# Patient Record
Sex: Female | Born: 1955 | Race: White | Hispanic: No | Marital: Married | Smoking: Former smoker
Health system: Southern US, Community
[De-identification: ages and names within clinical notes are randomized; demographics above are authoritative.]

## PROBLEM LIST (undated history)

## (undated) DIAGNOSIS — N951 Menopausal and female climacteric states: Secondary | ICD-10-CM

## (undated) DIAGNOSIS — M8580 Other specified disorders of bone density and structure, unspecified site: Secondary | ICD-10-CM

## (undated) DIAGNOSIS — R7989 Other specified abnormal findings of blood chemistry: Secondary | ICD-10-CM

## (undated) DIAGNOSIS — B019 Varicella without complication: Secondary | ICD-10-CM

## (undated) DIAGNOSIS — D649 Anemia, unspecified: Secondary | ICD-10-CM

## (undated) DIAGNOSIS — N390 Urinary tract infection, site not specified: Secondary | ICD-10-CM

## (undated) DIAGNOSIS — R011 Cardiac murmur, unspecified: Secondary | ICD-10-CM

## (undated) DIAGNOSIS — M1990 Unspecified osteoarthritis, unspecified site: Secondary | ICD-10-CM

## (undated) HISTORY — DX: Cardiac murmur, unspecified: R01.1

## (undated) HISTORY — DX: Other specified disorders of bone density and structure, unspecified site: M85.80

## (undated) HISTORY — DX: Other specified abnormal findings of blood chemistry: R79.89

## (undated) HISTORY — DX: Menopausal and female climacteric states: N95.1

## (undated) HISTORY — DX: Urinary tract infection, site not specified: N39.0

## (undated) HISTORY — DX: Varicella without complication: B01.9

---

## 1986-08-20 HISTORY — PX: AUGMENTATION MAMMAPLASTY: SUR837

## 1986-08-20 HISTORY — PX: BREAST SURGERY: SHX581

## 2000-01-19 ENCOUNTER — Encounter: Admission: RE | Admit: 2000-01-19 | Discharge: 2000-01-19 | Payer: Self-pay | Admitting: *Deleted

## 2000-01-19 ENCOUNTER — Encounter: Payer: Self-pay | Admitting: *Deleted

## 2001-03-28 ENCOUNTER — Encounter: Payer: Self-pay | Admitting: *Deleted

## 2001-03-28 ENCOUNTER — Encounter: Admission: RE | Admit: 2001-03-28 | Discharge: 2001-03-28 | Payer: Self-pay | Admitting: *Deleted

## 2001-04-10 ENCOUNTER — Encounter: Payer: Self-pay | Admitting: Family Medicine

## 2001-04-10 ENCOUNTER — Encounter: Admission: RE | Admit: 2001-04-10 | Discharge: 2001-04-10 | Payer: Self-pay | Admitting: Family Medicine

## 2001-08-28 ENCOUNTER — Other Ambulatory Visit: Admission: RE | Admit: 2001-08-28 | Discharge: 2001-08-28 | Payer: Self-pay | Admitting: *Deleted

## 2002-04-30 ENCOUNTER — Encounter: Admission: RE | Admit: 2002-04-30 | Discharge: 2002-04-30 | Payer: Self-pay | Admitting: *Deleted

## 2002-04-30 ENCOUNTER — Encounter: Payer: Self-pay | Admitting: *Deleted

## 2002-09-10 ENCOUNTER — Other Ambulatory Visit: Admission: RE | Admit: 2002-09-10 | Discharge: 2002-09-10 | Payer: Self-pay | Admitting: *Deleted

## 2003-05-31 ENCOUNTER — Encounter: Admission: RE | Admit: 2003-05-31 | Discharge: 2003-05-31 | Payer: Self-pay | Admitting: *Deleted

## 2003-05-31 ENCOUNTER — Encounter: Payer: Self-pay | Admitting: *Deleted

## 2003-09-02 ENCOUNTER — Other Ambulatory Visit: Admission: RE | Admit: 2003-09-02 | Discharge: 2003-09-02 | Payer: Self-pay | Admitting: *Deleted

## 2004-06-29 ENCOUNTER — Encounter: Admission: RE | Admit: 2004-06-29 | Discharge: 2004-06-29 | Payer: Self-pay | Admitting: *Deleted

## 2004-09-20 ENCOUNTER — Other Ambulatory Visit: Admission: RE | Admit: 2004-09-20 | Discharge: 2004-09-20 | Payer: Self-pay | Admitting: *Deleted

## 2005-07-20 ENCOUNTER — Encounter: Admission: RE | Admit: 2005-07-20 | Discharge: 2005-07-20 | Payer: Self-pay | Admitting: *Deleted

## 2005-08-14 ENCOUNTER — Encounter: Admission: RE | Admit: 2005-08-14 | Discharge: 2005-08-14 | Payer: Self-pay | Admitting: *Deleted

## 2005-11-26 ENCOUNTER — Other Ambulatory Visit: Admission: RE | Admit: 2005-11-26 | Discharge: 2005-11-26 | Payer: Self-pay | Admitting: *Deleted

## 2006-08-28 ENCOUNTER — Encounter: Admission: RE | Admit: 2006-08-28 | Discharge: 2006-08-28 | Payer: Self-pay | Admitting: *Deleted

## 2006-12-05 ENCOUNTER — Other Ambulatory Visit: Admission: RE | Admit: 2006-12-05 | Discharge: 2006-12-05 | Payer: Self-pay | Admitting: *Deleted

## 2007-09-19 ENCOUNTER — Encounter: Admission: RE | Admit: 2007-09-19 | Discharge: 2007-09-19 | Payer: Self-pay | Admitting: *Deleted

## 2007-12-18 ENCOUNTER — Other Ambulatory Visit: Admission: RE | Admit: 2007-12-18 | Discharge: 2007-12-18 | Payer: Self-pay | Admitting: Gynecology

## 2008-01-15 ENCOUNTER — Ambulatory Visit: Payer: Self-pay | Admitting: Internal Medicine

## 2008-01-15 DIAGNOSIS — N951 Menopausal and female climacteric states: Secondary | ICD-10-CM | POA: Insufficient documentation

## 2008-01-15 DIAGNOSIS — R011 Cardiac murmur, unspecified: Secondary | ICD-10-CM

## 2008-01-15 HISTORY — DX: Menopausal and female climacteric states: N95.1

## 2008-01-15 HISTORY — DX: Cardiac murmur, unspecified: R01.1

## 2008-01-22 ENCOUNTER — Encounter: Payer: Self-pay | Admitting: Internal Medicine

## 2008-01-22 ENCOUNTER — Ambulatory Visit: Payer: Self-pay

## 2008-02-27 ENCOUNTER — Ambulatory Visit: Payer: Self-pay | Admitting: Internal Medicine

## 2008-02-27 LAB — CONVERTED CEMR LAB
AST: 27 units/L (ref 0–37)
Albumin: 4 g/dL (ref 3.5–5.2)
Basophils Absolute: 0 10*3/uL (ref 0.0–0.1)
Basophils Relative: 0.3 % (ref 0.0–1.0)
Calcium: 9.2 mg/dL (ref 8.4–10.5)
Chloride: 104 meq/L (ref 96–112)
Cholesterol: 163 mg/dL (ref 0–200)
Creatinine, Ser: 0.8 mg/dL (ref 0.4–1.2)
Eosinophils Absolute: 0.2 10*3/uL (ref 0.0–0.7)
GFR calc Af Amer: 97 mL/min
GFR calc non Af Amer: 80 mL/min
Glucose, Urine, Semiquant: NEGATIVE
HCT: 35.8 % — ABNORMAL LOW (ref 36.0–46.0)
HDL: 52.8 mg/dL (ref >39.0)
MCHC: 34.9 g/dL (ref 30.0–36.0)
MCV: 98 fL (ref 78.0–100.0)
Monocytes Absolute: 0.4 10*3/uL (ref 0.1–1.0)
Neutrophils Relative %: 54.8 % (ref 43.0–77.0)
RBC: 3.66 M/uL — ABNORMAL LOW (ref 3.87–5.11)
Specific Gravity, Urine: 1.025
TSH: 1.02 microintl units/mL (ref 0.35–5.50)
Total Bilirubin: 0.8 mg/dL (ref 0.3–1.2)
VLDL: 8 mg/dL (ref 0–40)
pH: 5

## 2008-03-05 ENCOUNTER — Ambulatory Visit: Payer: Self-pay | Admitting: Internal Medicine

## 2008-06-03 ENCOUNTER — Ambulatory Visit: Payer: Self-pay | Admitting: Internal Medicine

## 2008-06-03 DIAGNOSIS — M8580 Other specified disorders of bone density and structure, unspecified site: Secondary | ICD-10-CM

## 2008-06-03 DIAGNOSIS — M818 Other osteoporosis without current pathological fracture: Secondary | ICD-10-CM | POA: Insufficient documentation

## 2008-06-03 HISTORY — DX: Other specified disorders of bone density and structure, unspecified site: M85.80

## 2008-09-22 ENCOUNTER — Encounter: Admission: RE | Admit: 2008-09-22 | Discharge: 2008-09-22 | Payer: Self-pay | Admitting: Gynecology

## 2008-10-08 ENCOUNTER — Ambulatory Visit: Payer: Self-pay | Admitting: Internal Medicine

## 2008-10-08 LAB — CONVERTED CEMR LAB
Calcium: 9.6 mg/dL
Vit D, 25-Hydroxy: 60 ng/mL

## 2009-09-30 ENCOUNTER — Encounter: Admission: RE | Admit: 2009-09-30 | Discharge: 2009-09-30 | Payer: Self-pay | Admitting: Gynecology

## 2010-09-09 ENCOUNTER — Encounter: Payer: Self-pay | Admitting: *Deleted

## 2010-11-01 ENCOUNTER — Other Ambulatory Visit: Payer: Self-pay | Admitting: Gynecology

## 2010-11-01 DIAGNOSIS — Z1231 Encounter for screening mammogram for malignant neoplasm of breast: Secondary | ICD-10-CM

## 2010-11-06 ENCOUNTER — Ambulatory Visit
Admission: RE | Admit: 2010-11-06 | Discharge: 2010-11-06 | Disposition: A | Discharge status: Home / Self Care | Payer: Self-pay | Source: Ambulatory Visit | Attending: Gynecology | Admitting: Gynecology

## 2010-11-06 DIAGNOSIS — Z1231 Encounter for screening mammogram for malignant neoplasm of breast: Secondary | ICD-10-CM

## 2011-05-29 ENCOUNTER — Encounter: Payer: Self-pay | Admitting: Internal Medicine

## 2011-05-29 ENCOUNTER — Ambulatory Visit (INDEPENDENT_AMBULATORY_CARE_PROVIDER_SITE_OTHER): Payer: BC Managed Care – PPO | Admitting: Internal Medicine

## 2011-05-29 VITALS — BP 118/70 | Temp 98.0°F | Wt 129.0 lb

## 2011-05-29 DIAGNOSIS — R0789 Other chest pain: Secondary | ICD-10-CM

## 2011-05-29 NOTE — Progress Notes (Signed)
  Subjective:    Patient ID: Bridget Mccarthy, female    DOB: 1955-09-27, 55 y.o.   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 She noted pain involving her right lateral chest wall 6 days ago. She has had persistent pain since this time. She completed her final performance 3 days ago and has had persistent discomfort. Pain is aggravated by deep inspiration and side to side twisting at the waist. She has a history of idiopathic osteoporosis but to her knowledge she has had no prior bone density study. She has been treated for vitamin D deficiency in the past but takes no supplements at the present time    Review of Systems  Constitutional: Negative.   HENT: Negative for hearing loss, congestion, sore throat, rhinorrhea, dental problem, sinus pressure and tinnitus.   Eyes: Negative for pain, discharge and visual disturbance.  Respiratory: Negative for cough and shortness of breath.   Cardiovascular: Positive for chest pain. Negative for palpitations and leg swelling.  Gastrointestinal: Negative for nausea, vomiting, abdominal pain, diarrhea, constipation, blood in stool and abdominal distention.  Genitourinary: Negative for dysuria, urgency, frequency, hematuria, flank pain, vaginal bleeding, vaginal discharge, difficulty urinating, vaginal pain and pelvic pain.  Musculoskeletal: Negative for joint swelling, arthralgias and gait problem.  Skin: Negative for rash.  Neurological: Negative for dizziness, syncope, speech difficulty, weakness, numbness and headaches.  Hematological: Negative for adenopathy.  Psychiatric/Behavioral: Negative for behavioral problems, dysphoric mood and agitation. The patient is not nervous/anxious.        Objective:   Physical Exam  Constitutional: She appears well-developed and well-nourished. No distress.  Pulmonary/Chest: Effort normal and breath sounds normal. No respiratory distress. She has no wheezes.       Slight tenderness in  the right lateral chest wall. Equal breath sounds pain is aggravated by side to side twisting at the waist          Assessment & Plan:   Chest wall pain. Suspect musculoligamentous. We'll treat with Advil and observe History of osteoporosis. We'll check a bone density calcium and vitamin D supplements encouraged

## 2011-05-29 NOTE — Patient Instructions (Signed)
Bone density study as discussed  Ibuprofen  As needed  Take a calcium supplement, plus 2000  units of vitamin D

## 2011-06-01 ENCOUNTER — Ambulatory Visit (INDEPENDENT_AMBULATORY_CARE_PROVIDER_SITE_OTHER)
Admission: RE | Admit: 2011-06-01 | Discharge: 2011-06-01 | Disposition: A | Discharge status: Home / Self Care | Payer: BC Managed Care – PPO | Source: Ambulatory Visit

## 2011-06-01 DIAGNOSIS — M818 Other osteoporosis without current pathological fracture: Secondary | ICD-10-CM

## 2011-06-01 DIAGNOSIS — R0789 Other chest pain: Secondary | ICD-10-CM

## 2011-06-06 ENCOUNTER — Encounter: Payer: Self-pay | Admitting: Internal Medicine

## 2011-07-11 ENCOUNTER — Telehealth: Payer: Self-pay | Admitting: Internal Medicine

## 2011-07-11 NOTE — Telephone Encounter (Signed)
Would start with an ultrasound that should define if the mass is coming from the implant. Normally the examining physician orders the MRI since they can answer the questions from the insurance company needed to justify the MRI. I am surprised that Dr. Audrie Lia did not order it if he felt it was a necessary   get the ultrasound done first

## 2011-07-11 NOTE — Telephone Encounter (Signed)
Pt called and said that she came in to see Dr Amador Cunas on 05/29/11 for pain in rt lower ribs. Pt says that she now has a knot under rt breast. Pt went to see Dr Collier Flowers, for checkup and that is when knot was discovered and said it could be pts breast implant. Recommended pt to get an MRI done, through Dr Lovell Sheehan. Pls call pt.

## 2011-07-11 NOTE — Telephone Encounter (Signed)
Unsure of whast needs to have mri- request pt to have dr Odis Luster, plastic surgeon, to send note and recommendation of what he th inks needs mri- they are closed until Monday, so sshe will call them on mondaty

## 2011-07-11 NOTE — Telephone Encounter (Signed)
Please help Bridget Mccarthy.

## 2011-07-18 ENCOUNTER — Other Ambulatory Visit: Payer: Self-pay | Admitting: Internal Medicine

## 2011-07-18 DIAGNOSIS — T8543XA Leakage of breast prosthesis and implant, initial encounter: Secondary | ICD-10-CM

## 2011-07-24 ENCOUNTER — Other Ambulatory Visit: Payer: Self-pay | Admitting: Internal Medicine

## 2011-07-26 ENCOUNTER — Other Ambulatory Visit: Payer: BC Managed Care – PPO

## 2011-11-15 ENCOUNTER — Other Ambulatory Visit: Payer: Self-pay | Admitting: Plastic Surgery

## 2012-08-21 ENCOUNTER — Encounter: Payer: Self-pay | Admitting: Family Medicine

## 2012-08-21 ENCOUNTER — Ambulatory Visit (INDEPENDENT_AMBULATORY_CARE_PROVIDER_SITE_OTHER): Payer: BC Managed Care – PPO | Admitting: Family Medicine

## 2012-08-21 VITALS — BP 110/70 | Temp 98.0°F | Wt 127.0 lb

## 2012-08-21 DIAGNOSIS — R071 Chest pain on breathing: Secondary | ICD-10-CM

## 2012-08-21 DIAGNOSIS — J069 Acute upper respiratory infection, unspecified: Secondary | ICD-10-CM

## 2012-08-21 DIAGNOSIS — R0789 Other chest pain: Secondary | ICD-10-CM | POA: Insufficient documentation

## 2012-08-21 MED ORDER — TRAMADOL HCL 50 MG PO TABS: ORAL_TABLET | ORAL | Status: DC

## 2012-08-21 MED ORDER — PREDNISONE 20 MG PO TABS: ORAL_TABLET | ORAL | Status: DC

## 2012-08-21 NOTE — Patient Instructions (Addendum)
Motrin 600 mg twice daily with food  Tramadol 50 mg,,,,,,,, one half to one tablet each bedtime when necessary for pain  Prednisone burst and taper return when necessary,

## 2012-08-21 NOTE — Progress Notes (Signed)
  Subjective:    Patient ID: Bridget Mccarthy, female    DOB: [birth year], 57 y.o.   MRN: [id number]  HPI Bridget Mccarthy is a 57 year old female nonsmoker who comes in today for evaluation of chest wall pain and a cough  She states about 6 weeks ago she developed a cold with head congestion sore throat and cough. The cough has persisted. She's never had a history of any pulmonary problems she's a nonsmoker. No fever no sputum production. Along with the cough she developed some chest wall pain which he describes as constant sometimes sharp sometimes dull hurts worse if she takes a deep breath and points to the left inferior breast there is a source of her pain. Review of systems otherwise negative   Review of Systems     Objective:   Physical Exam Well-developed well-nourished female no acute distress HEENT negative neck was supple no adenopathy lungs are clear cardiac exam normal breast exam normal       Assessment & Plan:  Viral syndrome with reactive airway disease plan prednisone burst and taper  Chest wall pain plan Motrin 600 twice a day tramadol each bedtime when necessary for pain

## 2015-07-13 ENCOUNTER — Other Ambulatory Visit: Payer: Self-pay

## 2015-07-13 DIAGNOSIS — Z1231 Encounter for screening mammogram for malignant neoplasm of breast: Secondary | ICD-10-CM

## 2015-08-23 ENCOUNTER — Ambulatory Visit: Payer: Self-pay

## 2015-08-25 ENCOUNTER — Ambulatory Visit
Admission: RE | Admit: 2015-08-25 | Discharge: 2015-08-25 | Disposition: A | Discharge status: Home / Self Care | Payer: BLUE CROSS/BLUE SHIELD | Source: Ambulatory Visit

## 2015-08-25 DIAGNOSIS — Z1231 Encounter for screening mammogram for malignant neoplasm of breast: Secondary | ICD-10-CM

## 2015-09-01 ENCOUNTER — Other Ambulatory Visit: Payer: Self-pay | Admitting: Obstetrics and Gynecology

## 2015-09-01 ENCOUNTER — Encounter: Payer: Self-pay | Admitting: Obstetrics and Gynecology

## 2015-09-01 ENCOUNTER — Ambulatory Visit (INDEPENDENT_AMBULATORY_CARE_PROVIDER_SITE_OTHER): Payer: BLUE CROSS/BLUE SHIELD | Admitting: Obstetrics and Gynecology

## 2015-09-01 VITALS — BP 110/66 | HR 70 | Resp 14 | Ht 64.5 in | Wt 130.2 lb

## 2015-09-01 DIAGNOSIS — Z1211 Encounter for screening for malignant neoplasm of colon: Secondary | ICD-10-CM

## 2015-09-01 DIAGNOSIS — Z01419 Encounter for gynecological examination (general) (routine) without abnormal findings: Secondary | ICD-10-CM

## 2015-09-01 DIAGNOSIS — M8580 Other specified disorders of bone density and structure, unspecified site: Secondary | ICD-10-CM | POA: Diagnosis not present

## 2015-09-01 DIAGNOSIS — Z113 Encounter for screening for infections with a predominantly sexual mode of transmission: Secondary | ICD-10-CM | POA: Diagnosis not present

## 2015-09-01 LAB — CBC
HEMATOCRIT: 39.1 % (ref 36.0–46.0)
Hemoglobin: 13 g/dL (ref 12.0–15.0)
MCH: 32.7 pg (ref 26.0–34.0)
MCHC: 33.2 g/dL (ref 30.0–36.0)
MCV: 98.5 fL (ref 78.0–100.0)
MPV: 10.7 fL (ref 8.6–12.4)
Platelets: 354 10*3/uL (ref 150–400)
RBC: 3.97 MIL/uL (ref 3.87–5.11)
RDW: 13.5 % (ref 11.5–15.5)
WBC: 6.3 10*3/uL (ref 4.0–10.5)

## 2015-09-01 LAB — COMPREHENSIVE METABOLIC PANEL
ALBUMIN: 4.5 g/dL (ref 3.6–5.1)
ALK PHOS: 67 U/L (ref 33–130)
ALT: 13 U/L (ref 6–29)
AST: 22 U/L (ref 10–35)
BILIRUBIN TOTAL: 0.5 mg/dL (ref 0.2–1.2)
BUN: 15 mg/dL (ref 7–25)
CALCIUM: 9.5 mg/dL (ref 8.6–10.4)
CO2: 27 mmol/L (ref 20–31)
Chloride: 100 mmol/L (ref 98–110)
Creat: 0.71 mg/dL (ref 0.50–1.05)
GLUCOSE: 121 mg/dL — AB (ref 65–99)
POTASSIUM: 4.7 mmol/L (ref 3.5–5.3)
Sodium: 140 mmol/L (ref 135–146)
Total Protein: 7.1 g/dL (ref 6.1–8.1)

## 2015-09-01 LAB — LIPID PANEL
CHOLESTEROL: 166 mg/dL (ref 125–200)
HDL: 67 mg/dL (ref >=46)
LDL CALC: 86 mg/dL (ref <130)
TRIGLYCERIDES: 66 mg/dL (ref <150)
Total CHOL/HDL Ratio: 2.5 Ratio (ref <=5.0)
VLDL: 13 mg/dL (ref <30)

## 2015-09-01 MED ORDER — ESTRADIOL 2 MG VA RING: 2.0000 mg | VAGINAL_RING | VAGINAL | Status: DC

## 2015-09-01 NOTE — Patient Instructions (Signed)

## 2015-09-01 NOTE — Progress Notes (Signed)
Patient ID: Bridget Mccarthy, female   DOB: 1956-08-20, 60 y.o.   MRN: [id number] 60 y.o. G0P0000 Married Caucasian female here for annual exam.    Has hx of a right silicone breast implant rupture.  Had an implant removal and reinsertion in 2013 after rupture. - Dr. Drenda Freeze.   Having painful intercourse. No real hot flashes.  Chose to not use HRT.  No vaginal bleeding or spotting.   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  PCP:  None  Patient's last menstrual period was 08/20/2008.          Sexually active: Yes.   female The current method of family planning is post menopausal status.    Exercising: Yes.    ballroom dancing, spinning, zumba, nia, yoga and weights. Smoker:  Former--quit 2005  Health Maintenance: Pap:  06/2014 normal per patient.  Uncertain about HR HPV testing.  History of abnormal Pap:  Yes, 1997 had abnormal pap but no colposcopy, no treatment.  Repeat pap normal. MMG:  08-26-15--see Epic  Stable right implant rupture.  Colonoscopy:  NEVER.   BMD:  06-01-11  Result :this was done at [facility]-- Osteopenia of hips and spine.  TDaP:  12/2007 Screening Labs:  Hb today: 12.4, Urine today:  Negative.    reports that she quit smoking about 12 years ago. She has never used smokeless tobacco. She reports that she drinks about 3.0 oz of alcohol per week. She reports that she does not use illicit drugs.  Past Medical History  Diagnosis Date  . OSTEOPOROSIS, IDIOPATHIC 06/03/2008  . SYMPTOMATIC MENOPAUSAL/FEMALE CLIMACTERIC STATES 01/15/2008  . Undiagnosed cardiac murmurs 01/15/2008    Past Surgical History  Procedure Laterality Date  . Breast surgery  1988    augmentation--Replaced 2013    Current Outpatient Prescriptions  Medication Sig Dispense Refill  . Multiple Vitamin (MULTIVITAMIN) capsule Take 1 capsule by mouth daily.    . Nutritional Supplements (UCD 2 PO) Take 1 tablet by mouth daily.     No current facility-administered medications for this visit.     Family History  Problem Relation Age of Onset  . Alcohol abuse Neg Hx     family hx  . Cancer Father     unknown origin--he was an alcoholic  . Other Mother     Dec 67 with complications from Shy-Drager Syndrome    ROS:  Pertinent items are noted in HPI.  Otherwise, a comprehensive ROS was negative.  Exam:   BP 110/66 mmHg  Pulse 70  Resp 14  Ht 5' 4.5" (1.638 m)  Wt 130 lb 3.2 oz (59.058 kg)  BMI 22.01 kg/m2  LMP 08/20/2008    General appearance: alert, cooperative and appears stated age Head: Normocephalic, without obvious abnormality, atraumatic Neck: no adenopathy, supple, symmetrical, trachea midline and thyroid normal to inspection and palpation Lungs: clear to auscultation bilaterally Breasts: No nipple retraction or dimpling, No nipple discharge or bleeding, No axillary or supraclavicular adenopathy, consistent with bilateral implants.  No dominant masses. Heart: regular rate and rhythm Abdomen: soft, non-tender; bowel sounds normal; no masses,  no organomegaly Extremities: extremities normal, atraumatic, no cyanosis or edema Skin: Skin color, texture, turgor normal. No rashes or lesions Lymph nodes: Cervical, supraclavicular, and axillary nodes normal. No abnormal inguinal nodes palpated Neurologic: Grossly normal  Pelvic: External genitalia:  no lesions              Urethra:  normal appearing urethra with no masses, tenderness or lesions  Bartholins and Skenes: normal                 Vagina: normal appearing vagina with normal color and discharge, no lesions              Cervix: no lesions              Pap taken: Yes.   Bimanual Exam:  Uterus:  normal size, contour, position, consistency, mobility, non-tender              Adnexa: normal adnexa and no mass, fullness, tenderness              Rectovaginal: Yes.  .  Confirms.              Anus:  normal sphincter tone, no lesions  Chaperone was present for exam.  Assessment:   Well woman visit  with normal exam. Hx of right breast implant rupture. Remote hx abnormal pap. Atrophic vaginal symptoms. Osteopenia.   Plan: Yearly mammogram recommended after age 60.  Recommended self breast exam.  Pap and HR HPV as above. Discussed Calcium, Vitamin D, regular exercise program including cardiovascular and weight bearing exercise. Labs performed.  Yes.  Routine labs, HIV, hep C. Refills given on medications.  Yes.  .  See orders.  Estring q 90 days.  Discussed risks of DTV, PE, MI, stroke, breast cancer.  Bone density.  Colonoscopy recommended. Referral made. Follow up annually and prn.      After visit summary provided.

## 2015-09-02 LAB — HEPATITIS C ANTIBODY: HCV AB: NEGATIVE

## 2015-09-02 LAB — HEMOGLOBIN A1C
HEMOGLOBIN A1C: 5.6 % (ref <5.7)
MEAN PLASMA GLUCOSE: 114 mg/dL (ref <117)

## 2015-09-02 LAB — HIV ANTIBODY (ROUTINE TESTING W REFLEX): HIV: NONREACTIVE

## 2015-09-02 LAB — VITAMIN D 25 HYDROXY (VIT D DEFICIENCY, FRACTURES): Vit D, 25-Hydroxy: 32 ng/mL (ref 30–100)

## 2015-09-02 LAB — TSH: TSH: 1.066 u[IU]/mL (ref 0.350–4.500)

## 2015-09-03 ENCOUNTER — Encounter: Payer: Self-pay | Admitting: Obstetrics and Gynecology

## 2015-09-05 ENCOUNTER — Telehealth: Payer: Self-pay | Admitting: Obstetrics and Gynecology

## 2015-09-05 LAB — IPS PAP TEST WITH HPV

## 2015-09-05 NOTE — Telephone Encounter (Signed)
Notes Recorded by Patton SallesBrook E Amundson C Silva, MD on 09/03/2015 at 7:48 PM Please inform patient of labs. Her testing is negative for HIV and Hep C. Cholesterol, thyroid, blood counts, and Vit D are normal.  Her glucose was elevated at 121, so I asked for a hemoglobin A1C to be done. This measured 5.6 which is normal. No concerns about her blood sugar level at this time.  I just recommend checking it yearly.   Her pap is pending.   Cc- Claudette LawsAmanda Dixon

## 2015-09-05 NOTE — Telephone Encounter (Addendum)
Patient has some questions about her mammogram that Dr. Edward JollySilva and her had discussed. Best # to reach: [phone] (best time to reach patient would be tomorrow since patient is flying out of town)

## 2015-09-06 NOTE — Telephone Encounter (Signed)
Spoke with patient. Patient has concerns about her mammogram report results from 08/25/2015. "I am not sure if it is saying I have a current leak in my right implant or that they are noting I had a previous leak in my right implant." Advised per review of report it is stating that the right breast is stable and that she had a previous implant rupture. Patient is agreeable. Advised I will also have a physician review. Advised of results as seen below from Dr.Silva. Patient verbalizes understanding.  IMPRESSION: 1. No mammographic evidence of malignancy. 2. Stable right breast free silicone from previous implant rupture. A result letter of this screening mammogram will be mailed directly to the patient.  RECOMMENDATION: Screening mammogram in one year. (Code:SM-B-01Y)  Routing to Dr.Jertson for review of patient's mammogram as Dr.Silva is out of the office today.

## 2015-09-08 NOTE — Telephone Encounter (Signed)
It appears that this is a previous implant rupture from what the mammogram report says. I believe her mammograms were previously done at [facility], and I do not have access to all of these reports.  If there is any question at all, it is best for the patient to see her breast surgeon, Dr. Drenda Freeze. Please set up a consultation for the patient to review the findings of her mammogram. He can best advise her about the rupture.  Thanks.

## 2015-09-09 NOTE — Telephone Encounter (Signed)
Spoke with patient. Advised of message as seen below from Dr.Silva. Patient verbalizes understanding. Patient denies any current concerns with her breast. States she just wanted to make sure everything was okay as she did not understand the report. Declines scheduling with breast surgeon at this time. Aware if she ever has any concern of implant rupture she will need to be seen with Dr.Bauers. Patient is agreeable.  Routing to provider for final review. Patient agreeable to disposition. Will close encounter.

## 2015-10-07 LAB — HM COLONOSCOPY

## 2016-09-14 ENCOUNTER — Ambulatory Visit (INDEPENDENT_AMBULATORY_CARE_PROVIDER_SITE_OTHER): Payer: BLUE CROSS/BLUE SHIELD | Admitting: Obstetrics and Gynecology

## 2016-09-14 ENCOUNTER — Encounter: Payer: Self-pay | Admitting: Obstetrics and Gynecology

## 2016-09-14 VITALS — BP 96/62 | HR 86 | Resp 16 | Ht 64.0 in | Wt 125.0 lb

## 2016-09-14 DIAGNOSIS — Z0000 Encounter for general adult medical examination without abnormal findings: Secondary | ICD-10-CM

## 2016-09-14 DIAGNOSIS — M8580 Other specified disorders of bone density and structure, unspecified site: Secondary | ICD-10-CM

## 2016-09-14 DIAGNOSIS — Z01419 Encounter for gynecological examination (general) (routine) without abnormal findings: Secondary | ICD-10-CM

## 2016-09-14 DIAGNOSIS — N6310 Unspecified lump in the right breast, unspecified quadrant: Secondary | ICD-10-CM | POA: Diagnosis not present

## 2016-09-14 LAB — CBC
HEMATOCRIT: 38.4 % (ref 35.0–45.0)
HEMOGLOBIN: 12.8 g/dL (ref 11.7–15.5)
MCH: 33.1 pg — ABNORMAL HIGH (ref 27.0–33.0)
MCHC: 33.3 g/dL (ref 32.0–36.0)
MCV: 99.2 fL (ref 80.0–100.0)
MPV: 10.9 fL (ref 7.5–12.5)
Platelets: 302 10*3/uL (ref 140–400)
RBC: 3.87 MIL/uL (ref 3.80–5.10)
RDW: 13.5 % (ref 11.0–15.0)
WBC: 7.3 10*3/uL (ref 3.8–10.8)

## 2016-09-14 LAB — POCT URINALYSIS DIPSTICK
Bilirubin, UA: NEGATIVE
GLUCOSE UA: NEGATIVE
Ketones, UA: NEGATIVE
Leukocytes, UA: NEGATIVE
Nitrite, UA: NEGATIVE
PH UA: 5
PROTEIN UA: NEGATIVE
RBC UA: NEGATIVE
UROBILINOGEN UA: NEGATIVE

## 2016-09-14 LAB — TSH: TSH: 1.03 mIU/L

## 2016-09-14 NOTE — Progress Notes (Signed)
61 y.o. G44P0000 Married Caucasian female here for annual exam.    Losing weight through exercise and clean eating.  Does exercise with an Actuary.   Did not use the Estring.  Decided she did not want this. Has it at home.  Expensive.   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   PCP:   No PCP.  Used to be Dr. Lovell Sheehan.   Patient's last menstrual period was 08/20/2008.           Sexually active: Yes.    The current method of family planning is post menopausal status.    Exercising: Yes.    Weights, cardio, dance Smoker:  no  Health Maintenance: Pap:  09/01/15 Neg. HR HPV:neg  History of abnormal Pap:  Yes, 1997 no colposcopy, no treatment.  Repeat pap normal. MMG:  08/26/15 Breast density D: extremely dense.  BIRADS1:Neg.  She will schedule.   Colonoscopy: 2017 Normal. F/u 5 years. Dr. Loreta Ave. BMD:   06/01/11  Result: Osteopenia.  Spine is - 2.0. TDaP:  12/2007 HIV: 09/01/15 Neg Hep C: 09/01/15 Neg Screening Labs:  Hb today: , Urine today: Negative   reports that she quit smoking about 13 years ago. She has never used smokeless tobacco. She reports that she drinks about 3.0 oz of alcohol per week . She reports that she does not use drugs.  Past Medical History:  Diagnosis Date  . OSTEOPOROSIS, IDIOPATHIC 06/03/2008  . SYMPTOMATIC MENOPAUSAL/FEMALE CLIMACTERIC STATES 01/15/2008  . Undiagnosed cardiac murmurs 01/15/2008    Past Surgical History:  Procedure Laterality Date  . BREAST SURGERY  1988   augmentation--Replaced 2013    Current Outpatient Prescriptions  Medication Sig Dispense Refill  . Multiple Vitamin (MULTIVITAMIN) capsule Take 1 capsule by mouth daily.     No current facility-administered medications for this visit.     Family History  Problem Relation Age of Onset  . Cancer Father     unknown origin--he was an alcoholic  . Other Mother     Dec 67 with complications from Shy-Drager Syndrome  . Alcohol abuse Neg Hx     family hx     ROS:  Pertinent items are noted in HPI.  Otherwise, a comprehensive ROS was negative.  Exam:   BP 96/62 (BP Location: Right Arm, Patient Position: Sitting, Cuff Size: Normal)   Pulse 86   Resp 16   Ht 5\' 4"  (1.626 m)   Wt 125 lb (56.7 kg)   LMP 08/20/2008   BMI 21.46 kg/m     General appearance: alert, cooperative and appears stated age Head: Normocephalic, without obvious abnormality, atraumatic Neck: no adenopathy, supple, symmetrical, trachea midline and thyroid normal to inspection and palpation Lungs: clear to auscultation bilaterally Breasts: bilateral implants, right breast mass at 7:00 - 1.5 cm, left breast no mass.  No nipple retraction or dimpling, No nipple discharge or bleeding, No axillary or supraclavicular adenopathy Heart: regular rate and rhythm Abdomen: soft, non-tender; no masses, no organomegaly Extremities: extremities normal, atraumatic, no cyanosis or edema Skin: Skin color, texture, turgor normal. No rashes or lesions Lymph nodes: Cervical, supraclavicular, and axillary nodes normal. No abnormal inguinal nodes palpated Neurologic: Grossly normal  Pelvic: External genitalia:  no lesions              Urethra:  normal appearing urethra with no masses, tenderness or lesions              Bartholins and Skenes: normal  Vagina: normal appearing vagina with normal color and discharge, no lesions.  Decreased rugae.               Cervix: no lesions              Pap taken: No. Bimanual Exam:  Uterus:  normal size, contour, position, consistency, mobility, non-tender              Adnexa: no mass, fullness, tenderness              Rectal exam: Yes.  .  Confirms.              Anus:  normal sphincter tone, no lesions  Chaperone was present for exam.  Assessment:   Well woman visit with normal exam. 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  Right breat mass today. Remote hx abnormal pap. Atrophic vaginal  symptoms. Osteopenia.   Plan: Will schedule diagnostic bilateral mammogram and right breast ultrasound. BMD ordered as well. Recommended self breast awareness. Pap and HR HPV as above. Routine labs.  Guidelines for Calcium, Vitamin D, regular exercise program including cardiovascular and weight bearing exercise. BMD ordered.    Follow up annually and prn.      After visit summary provided.

## 2016-09-14 NOTE — Patient Instructions (Signed)

## 2016-09-14 NOTE — Progress Notes (Signed)
Spoke with Bridget Mccarthy at [facility]. Patient scheduled while in office for bilateral diagnostic MMG with implants and right breast ultrasound 09/20/16 at 2pm and BMD 2/15 at 3pm. Patient verbalizes understanding and is agreeable to date and times.

## 2016-09-15 LAB — COMPREHENSIVE METABOLIC PANEL
ALBUMIN: 4.3 g/dL (ref 3.6–5.1)
ALK PHOS: 66 U/L (ref 33–130)
ALT: 14 U/L (ref 6–29)
AST: 21 U/L (ref 10–35)
BILIRUBIN TOTAL: 0.5 mg/dL (ref 0.2–1.2)
BUN: 17 mg/dL (ref 7–25)
CALCIUM: 9.4 mg/dL (ref 8.6–10.4)
CO2: 24 mmol/L (ref 20–31)
Chloride: 100 mmol/L (ref 98–110)
Creat: 0.61 mg/dL (ref 0.50–0.99)
Glucose, Bld: 92 mg/dL (ref 65–99)
POTASSIUM: 4.2 mmol/L (ref 3.5–5.3)
Sodium: 139 mmol/L (ref 135–146)
Total Protein: 7.1 g/dL (ref 6.1–8.1)

## 2016-09-15 LAB — VITAMIN D 25 HYDROXY (VIT D DEFICIENCY, FRACTURES): Vit D, 25-Hydroxy: 34 ng/mL (ref 30–100)

## 2016-09-15 LAB — LIPID PANEL
CHOLESTEROL: 141 mg/dL (ref <200)
HDL: 58 mg/dL (ref >50)
LDL Cholesterol: 72 mg/dL (ref <100)
Total CHOL/HDL Ratio: 2.4 Ratio (ref <5.0)
Triglycerides: 56 mg/dL (ref <150)
VLDL: 11 mg/dL (ref <30)

## 2016-09-20 ENCOUNTER — Other Ambulatory Visit: Payer: Self-pay | Admitting: Obstetrics and Gynecology

## 2016-09-20 ENCOUNTER — Ambulatory Visit
Admission: RE | Admit: 2016-09-20 | Discharge: 2016-09-20 | Disposition: A | Discharge status: Home / Self Care | Payer: BLUE CROSS/BLUE SHIELD | Source: Ambulatory Visit | Attending: Obstetrics and Gynecology | Admitting: Obstetrics and Gynecology

## 2016-09-20 DIAGNOSIS — N6310 Unspecified lump in the right breast, unspecified quadrant: Secondary | ICD-10-CM

## 2016-09-20 DIAGNOSIS — M8580 Other specified disorders of bone density and structure, unspecified site: Secondary | ICD-10-CM

## 2016-10-04 ENCOUNTER — Other Ambulatory Visit: Payer: BLUE CROSS/BLUE SHIELD

## 2017-08-20 DIAGNOSIS — R7989 Other specified abnormal findings of blood chemistry: Secondary | ICD-10-CM

## 2017-08-20 HISTORY — DX: Other specified abnormal findings of blood chemistry: R79.89

## 2017-09-20 ENCOUNTER — Other Ambulatory Visit: Payer: Self-pay

## 2017-09-20 ENCOUNTER — Ambulatory Visit (INDEPENDENT_AMBULATORY_CARE_PROVIDER_SITE_OTHER): Payer: BLUE CROSS/BLUE SHIELD | Admitting: Obstetrics and Gynecology

## 2017-09-20 ENCOUNTER — Encounter: Payer: Self-pay | Admitting: Obstetrics and Gynecology

## 2017-09-20 VITALS — BP 110/62 | HR 76 | Resp 16 | Ht 64.0 in | Wt 118.0 lb

## 2017-09-20 DIAGNOSIS — Z01419 Encounter for gynecological examination (general) (routine) without abnormal findings: Secondary | ICD-10-CM | POA: Diagnosis not present

## 2017-09-20 NOTE — Progress Notes (Signed)
62 y.o. G37P0000 Married Caucasian female here for annual exam.    Denies vaginal bleeding.   Caring for a friend with sarcoma of the uterus. Husband doing tx for bladder cancer. Not very sexually active right now.   PCP:   No PCP.    Patient's last menstrual period was 08/20/2008.           Sexually active: Yes.    The current method of family planning is post menopausal status.    Exercising: Yes.    cardio, weight bearing, yoga, ballroom dancing Smoker:  no  Health Maintenance: Pap:  09/01/15 Pap and HR HPV negative History of abnormal Pap:  Yes in 1997.  Had colposcopy and no treatment.  Repeat pap normal. MMG:  09/20/16 w/ [place] of Rt breast -- BIRADS 2 benign.  Silicone extravasation noted on right (old change from prior rupture). Colonoscopy:  2017 Normal. F/u 5 years. Dr. Loreta Ave BMD:   09/20/16  Result  Osteopenia. Spine is - 2.0 TDaP:  12/2007 Gardasil:   n/a HIV and Hep C: 09/01/15 negative Screening Labs: discuss today   reports that she quit smoking about 14 years ago. she has never used smokeless tobacco. She reports that she drinks about 3.0 oz of alcohol per week. She reports that she does not use drugs.  Past Medical History:  Diagnosis Date  . OSTEOPOROSIS, IDIOPATHIC 06/03/2008  . SYMPTOMATIC MENOPAUSAL/FEMALE CLIMACTERIC STATES 01/15/2008  . Undiagnosed cardiac murmurs 01/15/2008    Past Surgical History:  Procedure Laterality Date  . BREAST SURGERY  1988   augmentation--Replaced 2013    Current Outpatient Medications  Medication Sig Dispense Refill  . Multiple Vitamin (MULTIVITAMIN) capsule Take 1 capsule by mouth daily.     No current facility-administered medications for this visit.     Family History  Problem Relation Age of Onset  . Cancer Father        unknown origin--he was an alcoholic  . Other Mother        Dec 67 with complications from Shy-Drager Syndrome  . Alcohol abuse Neg Hx        family hx    ROS:  Pertinent items are noted in HPI.   Otherwise, a comprehensive ROS was negative.  Exam:   BP 110/62 (BP Location: Right Arm, Patient Position: Sitting, Cuff Size: Normal)   Pulse 76   Resp 16   Ht 5\' 4"  (1.626 m)   Wt 118 lb (53.5 kg)   LMP 08/20/2008   BMI 20.25 kg/m     General appearance: alert, cooperative and appears stated age Head: Normocephalic, without obvious abnormality, atraumatic Neck: no adenopathy, supple, symmetrical, trachea midline and thyroid normal to inspection and palpation Lungs: clear to auscultation bilaterally Breasts: bilateral implants, normal appearance, right breast with 1 cm subtle mass at 7 - 8:00 (silicone extravasation by prior imaging - no change), no tenderness, No nipple retraction or dimpling, No nipple discharge or bleeding, No axillary or supraclavicular adenopathy Heart: regular rate and rhythm Abdomen: soft, non-tender; no masses, no organomegaly Extremities: extremities normal, atraumatic, no cyanosis or edema Skin: Skin color, texture, turgor normal. No rashes or lesions Lymph nodes: Cervical, supraclavicular, and axillary nodes normal. No abnormal inguinal nodes palpated Neurologic: Grossly normal  Pelvic: External genitalia:  no lesions              Urethra:  normal appearing urethra with no masses, tenderness or lesions              Bartholins  and Skenes: normal                 Vagina: normal appearing vagina with normal color and discharge, no lesions.  Vaginal atrophy noted.               Cervix: no lesions              Pap taken: No. Bimanual Exam:  Uterus:  normal size, contour, position, consistency, mobility, non-tender              Adnexa: no mass, fullness, tenderness              Rectal exam: Yes.  .  Confirms.              Anus:  normal sphincter tone, no lesions  Chaperone was present for exam.  Assessment:   Well woman visit with normal exam. 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  Remote hx abnormal pap. Atrophic vaginal  symptoms. Osteopenia.   Plan: Mammogram screening discussed. Recommended self breast awareness. Pap and HR HPV as above. Guidelines for Calcium, Vitamin D, regular exercise program including cardiovascular and weight bearing exercise. Declines vaginal estrogens after reviewing risks/benefits.  She will try vit E and coconut oil. BMD next year.  Follow up annually and prn.   After visit summary provided.

## 2017-09-20 NOTE — Patient Instructions (Signed)

## 2017-09-21 LAB — CBC
HEMATOCRIT: 40.5 % (ref 34.0–46.6)
HEMOGLOBIN: 13.2 g/dL (ref 11.1–15.9)
MCH: 33.1 pg — ABNORMAL HIGH (ref 26.6–33.0)
MCHC: 32.6 g/dL (ref 31.5–35.7)
MCV: 102 fL — ABNORMAL HIGH (ref 79–97)
Platelets: 324 10*3/uL (ref 150–379)
RBC: 3.99 x10E6/uL (ref 3.77–5.28)
RDW: 13.5 % (ref 12.3–15.4)
WBC: 7.5 10*3/uL (ref 3.4–10.8)

## 2017-09-21 LAB — COMPREHENSIVE METABOLIC PANEL
ALBUMIN: 4.9 g/dL — AB (ref 3.6–4.8)
ALK PHOS: 82 IU/L (ref 39–117)
ALT: 17 IU/L (ref 0–32)
AST: 23 IU/L (ref 0–40)
Albumin/Globulin Ratio: 1.8 (ref 1.2–2.2)
BUN / CREAT RATIO: 19 (ref 12–28)
BUN: 13 mg/dL (ref 8–27)
Bilirubin Total: 0.6 mg/dL (ref 0.0–1.2)
CALCIUM: 9.3 mg/dL (ref 8.7–10.3)
CO2: 26 mmol/L (ref 20–29)
CREATININE: 0.68 mg/dL (ref 0.57–1.00)
Chloride: 97 mmol/L (ref 96–106)
GFR calc Af Amer: 109 mL/min/{1.73_m2} (ref >59)
GFR calc non Af Amer: 95 mL/min/{1.73_m2} (ref >59)
GLOBULIN, TOTAL: 2.8 g/dL (ref 1.5–4.5)
GLUCOSE: 94 mg/dL (ref 65–99)
Potassium: 3.7 mmol/L (ref 3.5–5.2)
SODIUM: 140 mmol/L (ref 134–144)
Total Protein: 7.7 g/dL (ref 6.0–8.5)

## 2017-09-21 LAB — LIPID PANEL
CHOLESTEROL TOTAL: 164 mg/dL (ref 100–199)
Chol/HDL Ratio: 2.3 ratio (ref 0.0–4.4)
HDL: 71 mg/dL (ref >39)
LDL Calculated: 82 mg/dL (ref 0–99)
Triglycerides: 55 mg/dL (ref 0–149)
VLDL Cholesterol Cal: 11 mg/dL (ref 5–40)

## 2017-09-21 LAB — VITAMIN D 25 HYDROXY (VIT D DEFICIENCY, FRACTURES): VIT D 25 HYDROXY: 17 ng/mL — AB (ref 30.0–100.0)

## 2017-09-21 LAB — TSH: TSH: 0.985 u[IU]/mL (ref 0.450–4.500)

## 2017-09-22 ENCOUNTER — Encounter: Payer: Self-pay | Admitting: Obstetrics and Gynecology

## 2017-09-22 ENCOUNTER — Other Ambulatory Visit: Payer: Self-pay | Admitting: Obstetrics and Gynecology

## 2017-09-22 DIAGNOSIS — R7989 Other specified abnormal findings of blood chemistry: Secondary | ICD-10-CM

## 2017-09-23 ENCOUNTER — Telehealth: Payer: Self-pay | Admitting: *Deleted

## 2017-09-23 NOTE — Telephone Encounter (Signed)
Message left to return call to Najat Olazabal at [phone].    

## 2017-09-23 NOTE — Telephone Encounter (Signed)
-----   Message from Patton SallesBrook E Amundson C Silva, MD sent at 09/22/2017  3:16 PM EST ----- Please report lab results to patient.  Her has low vit D, and I am recommending vit D 50,000 IU every 2 weeks for 3 months.  She will need a lab appointment for a recheck.  I will place a future order.   Her albumin level is just above the normal range.  This may reflect her dietary choices or any protein supplements that she is consuming.   Her thyroid and cholesterol are normal.  Her CBC shows her red blood cells measure just above the normal size.  This is not of significance because she is not anemic.  No further evaluation is needed.

## 2017-10-01 ENCOUNTER — Other Ambulatory Visit: Payer: Self-pay | Admitting: Obstetrics and Gynecology

## 2017-10-01 DIAGNOSIS — Z1231 Encounter for screening mammogram for malignant neoplasm of breast: Secondary | ICD-10-CM

## 2017-10-07 MED ORDER — VITAMIN D (ERGOCALCIFEROL) 1.25 MG (50000 UNIT) PO CAPS: 50000.0000 [IU] | ORAL_CAPSULE | ORAL | 0 refills | Status: DC

## 2017-10-07 NOTE — Telephone Encounter (Signed)
Call to patient. Results reviewed with patient as seen below from Dr. Edward JollySilva and patient verbalized understanding. Pharmacy confirmed- [facility] [address]. Vitamin d [id number] IU, #6, 0RF sent to pharmacy. Instructions on use reviewed with patient and she verbalized understanding. 3 month lab recheck scheduled for 12/27/17 at 1100. Patient agreeable to date and time of appointment. Future order present for lab work.   Patient agreeable to disposition. Will close encounter.

## 2017-10-18 ENCOUNTER — Ambulatory Visit
Admission: RE | Admit: 2017-10-18 | Discharge: 2017-10-18 | Disposition: A | Discharge status: Home / Self Care | Payer: BLUE CROSS/BLUE SHIELD | Source: Ambulatory Visit | Attending: Obstetrics and Gynecology | Admitting: Obstetrics and Gynecology

## 2017-10-18 DIAGNOSIS — Z1231 Encounter for screening mammogram for malignant neoplasm of breast: Secondary | ICD-10-CM

## 2017-12-27 ENCOUNTER — Other Ambulatory Visit: Payer: BLUE CROSS/BLUE SHIELD

## 2017-12-30 ENCOUNTER — Ambulatory Visit (INDEPENDENT_AMBULATORY_CARE_PROVIDER_SITE_OTHER): Payer: BLUE CROSS/BLUE SHIELD

## 2017-12-30 ENCOUNTER — Ambulatory Visit: Payer: BLUE CROSS/BLUE SHIELD | Admitting: Sports Medicine

## 2017-12-30 ENCOUNTER — Encounter: Payer: Self-pay | Admitting: Sports Medicine

## 2017-12-30 VITALS — BP 98/64 | HR 73 | Ht 64.0 in | Wt 121.6 lb

## 2017-12-30 DIAGNOSIS — M25562 Pain in left knee: Secondary | ICD-10-CM | POA: Diagnosis not present

## 2017-12-30 MED ORDER — DICLOFENAC SODIUM 2 % TD SOLN: 1.0000 "application " | Freq: Two times a day (BID) | TRANSDERMAL | 0 refills | Status: AC

## 2017-12-30 NOTE — Progress Notes (Signed)
Bridget Mccarthy. [facility] [phone]  Faydra Korman - 62 y.o. female MRN [id number]  Date of birth: 1955-10-09  Visit Date: 12/30/2017  PCP: Patient, No Pcp Per   Referred by: No ref. provider found  Scribe for today's visit: Stevenson Clinch, CMA     SUBJECTIVE:  Bridget Mccarthy is here for Initial Assessment (L knee )  Her L knee pain symptoms INITIALLY: Began 0/12/19 and began after a fall.  Described as moderate-severe sharp aching, radiating to lateral aspect of the lower leg.  Worsened with weight bearing or walking on her toes. Worse when turning foot in either direction.  Improved with rest. Additional associated symptoms include: There is swelling and bruising present. She has noticed increased tightness behind the knee. Pain increases with movement, it hurts to bend and extend the knee. She has notices some popping in the knee and feels the need to pop her knee since falling.     At this time symptoms are improving compared to onset  She has been taking Aleve and icing the knee prn with minimal relief.   ROS Denies night time disturbances. Denies fevers, chills, or night sweats. Denies unexplained weight loss. Denies personal history of cancer. Denies changes in bowel or bladder habits. Reports recent unreported falls. Denies new or worsening dyspnea or wheezing. Denies headaches or dizziness.  Reports numbness, tingling in 4th and 5th toes of R foot.  Denies dizziness or presyncopal episodes Reports lower extremity edema    HISTORY & PERTINENT PRIOR DATA:  Prior History reviewed and updated per electronic medical record.  Significant/pertinent history, findings, studies include:  reports that she quit smoking about 14 years ago. She has never used smokeless tobacco. No results for input(s): HGBA1C, LABURIC, CREATINE in the last 8760 hours. No specialty comments available. No problems  updated.  OBJECTIVE:  VS:  HT:5\' 4"  (162.6 cm)   WT:121 lb 9.6 oz (55.2 kg)  BMI:20.86    BP:98/64  HR:73bpm  TEMP: ( )  RESP:96 %   PHYSICAL EXAM: Constitutional: WDWN, Non-toxic appearing. Psychiatric: Alert & appropriately interactive.  Not depressed or anxious appearing. Respiratory: No increased work of breathing.  Trachea Midline Eyes: Pupils are equal.  EOM intact without nystagmus.  No scleral icterus  Vascular Exam: warm to touch no edema  lower extremity neuro exam: unremarkable normal strength normal sensation normal reflexes  MSK Exam: Left knee overall well aligned.  Ligamentously stable.  She is distal lateral knee more focally over the fibular head but no bony step-off no significant bruising or ecchymosis.  Mild pain with Neer's testing.  No pain with McMurray's.  Difficulty with flexion extension to localizing pain but no crepitation or weakness appreciated.   ASSESSMENT & PLAN:   1. Left knee pain, unspecified chronicity     PLAN: Left knee contusion from her dog directly striking the inferior lateral aspect of her knee.  Likely related to the weakness that she is having received but no other mechanical symptoms.  Continue with ice topical anti-inflammatories and given the overall good improvement last 24 hours we will defer any further intervention at this time.  X-rays were reassuring.  Pain lack of improvement MSK ultrasound and consideration of injection/aspiration can be considered.  To follow-up if any worsening symptoms by the end of the week.  Topical Pennsaid.  Follow-up: Return if symptoms worsen or fail to improve.      Please see additional documentation for Objective,  Assessment and Plan sections. Pertinent additional documentation may be included in corresponding procedure notes, imaging studies, problem based documentation and patient instructions. Please see these sections of the encounter for additional information regarding this  visit.  CMA/ATC served as Neurosurgeon during this visit. History, Physical, and Plan performed by medical provider. Documentation and orders reviewed and attested to.      Andrena Mews, DO    [facility]

## 2018-01-02 ENCOUNTER — Encounter: Payer: Self-pay | Admitting: Sports Medicine

## 2018-01-03 ENCOUNTER — Other Ambulatory Visit (INDEPENDENT_AMBULATORY_CARE_PROVIDER_SITE_OTHER): Payer: BLUE CROSS/BLUE SHIELD

## 2018-01-03 ENCOUNTER — Encounter: Payer: Self-pay | Admitting: Family Medicine

## 2018-01-03 ENCOUNTER — Ambulatory Visit (INDEPENDENT_AMBULATORY_CARE_PROVIDER_SITE_OTHER): Payer: BLUE CROSS/BLUE SHIELD | Admitting: Family Medicine

## 2018-01-03 VITALS — BP 98/68 | HR 72 | Temp 98.1°F | Ht 64.0 in | Wt 120.2 lb

## 2018-01-03 DIAGNOSIS — E559 Vitamin D deficiency, unspecified: Secondary | ICD-10-CM | POA: Insufficient documentation

## 2018-01-03 DIAGNOSIS — Z87891 Personal history of nicotine dependence: Secondary | ICD-10-CM | POA: Diagnosis not present

## 2018-01-03 DIAGNOSIS — Z0000 Encounter for general adult medical examination without abnormal findings: Secondary | ICD-10-CM | POA: Diagnosis not present

## 2018-01-03 DIAGNOSIS — R7989 Other specified abnormal findings of blood chemistry: Secondary | ICD-10-CM

## 2018-01-03 NOTE — Progress Notes (Signed)
Phone: [phone]  Subjective:  Patient presents today to establish care.  Prior patient of Dr. Lovell Sheehan in 2014 or 2015. Chief complaint-noted.   See problem oriented charting  The following were reviewed and entered/updated in epic: Past Medical History:  Diagnosis Date  . Chicken pox   . Low vitamin D level 2019  . Osteopenia 06/03/2008  . SYMPTOMATIC MENOPAUSAL/FEMALE CLIMACTERIC STATES 01/15/2008  . Undiagnosed cardiac murmurs 01/15/2008   Was told by prior GI doctor that she had a murmur. hasnt been heard by other providers  . UTI (urinary tract infection)    Patient Active Problem List   Diagnosis Date Noted  . Vitamin D deficiency 01/03/2018    Priority: Low  . Former smoker 01/03/2018    Priority: Low   Past Surgical History:  Procedure Laterality Date  . BREAST SURGERY  1988   augmentation--Replaced 2013    Family History  Problem Relation Age of Onset  . Cancer Father        unknown origin/cause--he was an alcoholic  . Alcohol abuse Father        estranged from her life  . Other Mother        Dec 67 with complications from Shy-Drager Syndrome  . Alcohol abuse Maternal Grandmother   . Lung cancer Maternal Grandfather        heavy smoker  . Other Paternal Grandmother        she died after father's birth    Medications- reviewed and updated Current Outpatient Medications  Medication Sig Dispense Refill  . Diclofenac Sodium 2 % SOLN Place onto the skin as needed.    . naproxen sodium (ALEVE) 220 MG tablet Take 220 mg by mouth.     No current facility-administered medications for this visit.     Allergies-reviewed and updated Allergies  Allergen Reactions  . Shrimp [Shellfish Allergy]     Throat swelling- likely anaphylaxis. Didn't have to take benadryl or otherthough and resolved    Social History   Social History Narrative   Married. Dr. Durene Cal sees her husband.       Works as Midwife   4 year college at [employer]      Hobbies: ballroom dancing twice a week    ROS--Full ROS was completed Review of Systems  Constitutional: Negative for chills and fever.  HENT: Negative for hearing loss and tinnitus.   Eyes: Negative for blurred vision and double vision.  Respiratory: Negative for cough and hemoptysis.   Cardiovascular: Negative for chest pain and palpitations.  Gastrointestinal: Negative for heartburn and nausea.  Genitourinary: Negative for dysuria and urgency.       Painful sex  Musculoskeletal: Positive for joint pain. Negative for back pain.  Skin: Negative for itching and rash.  Neurological: Negative for dizziness and headaches.  Endo/Heme/Allergies: Negative for polydipsia. Does not bruise/bleed easily.  Psychiatric/Behavioral: Negative for hallucinations and substance abuse.   Objective: BP 98/68 (BP Location: Left Arm, Patient Position: Sitting, Cuff Size: Normal)   Pulse 72   Temp 98.1 F (36.7 C) (Oral)   Ht  (1.626 m)   Wt 120 lb 3.2 oz (54.5 kg)   LMP 08/20/2008   SpO2 94%   BMI 20.63 kg/m  Gen: NAD, resting comfortably, appears younger than stated age HEENT: Mucous membranes are moist. Oropharynx normal. TM normal. Eyes: sclera and lids normal, PERRLA Neck: no thyromegaly, no cervical lymphadenopathy CV: RRR no murmurs rubs or gallops Lungs: CTAB no crackles, wheeze,  rhonchi Abdomen: soft/nontender/nondistended/normal bowel sounds. No rebound or guarding.  Toned abdomen Ext: no edema Skin: warm, dry Neuro: 5/5 strength in upper and lower extremities, normal gait, normal reflexes  Assessment/Plan:  62 y.o. female presenting for annual physical.  Health Maintenance counseling: 1. Anticipatory guidance: Patient counseled regarding regular dental exams -q6 months, eye exams -yearly, wearing seatbelts.  2. Risk factor reduction:  Advised patient of need for regular exercise and diet rich and fruits and vegetables to reduce risk of heart attack and stroke.  Exercise- 3-5 days a week. Diet-healthy diet.  Wt Readings from Last 3 Encounters:  01/03/18 120 lb 3.2 oz (54.5 kg)  12/30/17 121 lb 9.6 oz (55.2 kg)  09/20/17 118 lb (53.5 kg)  3. Immunizations/screenings/ancillary studies- discussed shingrix- we are out right now though.  Immunization History  Administered Date(s) Administered  . Td 01/15/2008   4. Cervical cancer screening- 09/01/15 with 3 year repeat planned 5. Breast cancer screening-  breast exam with GYN and mammogram 3/1/190 does 3d due to implants 6. Colon cancer screening - get records from Dr. Loreta Ave from 2016 colonoscopy 7. Skin cancer screening- no dermatologist. advised regular sunscreen use. Denies worrisome, changing, or new skin lesions.  8. Birth control/STD check- postmenopausal/monogomous 9. Osteoporosis screening at 47- has osteopenia- following with gynecology  Status of chronic or acute concerns   Painful sex with husband. He had bladder cancer and instrument. Estrogen cream didn't work.  [facility].  Recently saw Dr. Berline Chough for knee pain.  Pain is at least 50% improved.  She is still concerned about going to work next week.  We discussed if she is not able to get back to work on Monday or Tuesday to schedule a follow-up with Dr. Berline Chough next week  Was told by prior GI doctor that she had a murmur. hasnt been heard by other providers.  I did not hear this today  Former smoker- quit 2005- 2 pack years.  With such a low smoking history I do not think she needs regular urinalysis.  Vitamin D deficiency Followed by gyn.  She just finished boost of high-dose vitamin D and had her labs drawn today by gynecology   Future Appointments  Date Time Provider Department Center  09/25/2018  1:30 PM Ardell Isaacs, Forrestine Him, MD [facility] None   Return in about 1 year (around 01/04/2019) for follow up- or sooner if needed.  Return precautions advised.  Tana Conch, MD

## 2018-01-03 NOTE — Patient Instructions (Addendum)
Health Maintenance Due  Topic Date Due  . COLONOSCOPY - Our team will call Dr. Loreta Ave and request records. 07/26/2006   We did a physical today- so ask the desk to refund your copay- any questions they can come ask me  You are doing fantastic.  I am happy to see back yearly for a physical if you would like. Lets do 2- 2.5 years at the latest.   I would consider doing a B12 and folate level if your MCV on your CBC remains high on future labs.

## 2018-01-03 NOTE — Assessment & Plan Note (Signed)
Followed by gyn.  She just finished boost of high-dose vitamin D and had her labs drawn today by gynecology

## 2018-01-04 LAB — VITAMIN D 25 HYDROXY (VIT D DEFICIENCY, FRACTURES): Vit D, 25-Hydroxy: 38.4 ng/mL (ref 30.0–100.0)

## 2018-01-06 ENCOUNTER — Ambulatory Visit: Payer: BLUE CROSS/BLUE SHIELD | Admitting: Family Medicine

## 2018-01-25 ENCOUNTER — Other Ambulatory Visit: Payer: Self-pay | Admitting: Obstetrics and Gynecology

## 2018-09-25 ENCOUNTER — Ambulatory Visit: Payer: BLUE CROSS/BLUE SHIELD | Admitting: Obstetrics and Gynecology

## 2018-10-02 NOTE — Progress Notes (Signed)
63 y.o. G0P0000 Married Caucasian female here for annual exam.    Left knee injury.  She will return to her provider for an MRI.  Using liquid vit D sporadically.   Husband is doing well following bladder cancer treatment.   NWG:NFAOZHYPCP:Stephen Durene CalHunter, MD    Patient's last menstrual period was 08/20/2008.           Sexually active: Yes.    female The current method of family planning is post menopausal status.    Exercising: Yes.    weights, cardio, ballroom dancing Smoker:  no  Health Maintenance: Pap: 09-01-15 Neg:Neg HR HPV,  History of abnormal Pap:  Yes, Hx of cryotherapy 20 years ago. 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 Spine is -2.0.  Hip is -2/2. TDaP:  12/2007 Gardasil:   no HIV: 09-01-15 NR Hep C: 09-01-15 Screening Labs:   ---   reports that she quit smoking about 15 years ago. She has a 2.00 pack-year smoking history. She has never used smokeless tobacco. She reports current alcohol use of about 5.0 - 7.0 standard drinks of alcohol per week. She reports that she does not use drugs.  Past Medical History:  Diagnosis Date  . Chicken pox   . Low vitamin D level 2019  . Osteopenia 06/03/2008  . SYMPTOMATIC MENOPAUSAL/FEMALE CLIMACTERIC STATES 01/15/2008  . Undiagnosed cardiac murmurs 01/15/2008   Was told by prior GI doctor that she had a murmur. hasnt been heard by other providers  . UTI (urinary tract infection)     Past Surgical History:  Procedure Laterality Date  . BREAST SURGERY  1988   augmentation--Replaced 2013    Current Outpatient Medications  Medication Sig Dispense Refill  . naproxen sodium (ALEVE) 220 MG tablet Take 220 mg by mouth.     No current facility-administered medications for this visit.     Family History  Problem Relation Age of Onset  .  Cancer Father        unknown origin/cause--he was an alcoholic  . Alcohol abuse Father        estranged from her life  . Other Mother        Dec 67 with complications from Shy-Drager Syndrome  . Alcohol abuse Maternal Grandmother   . Lung cancer Maternal Grandfather        heavy smoker  . Other Paternal Grandmother        she died after father's birth    Review of Systems  All other systems reviewed and are negative.   Exam:   BP (!) 100/58 (BP Location: Right Arm, Patient Position: Sitting, Cuff Size: Normal)   Pulse 76   Resp 14   Ht 5' 4.25" (1.632 m)   Wt 123 lb 6.4 oz (56 kg)   LMP 08/20/2008   BMI 21.02 kg/m     General appearance: alert, cooperative and appears stated age Head: Normocephalic, without obvious abnormality, atraumatic Neck: no adenopathy, supple, symmetrical, trachea midline and thyroid normal to inspection and palpation Lungs: clear to auscultation bilaterally Breasts: normal appearance, no masses or tenderness, No nipple retraction or dimpling, No nipple discharge or bleeding, No axillary or supraclavicular adenopathy Heart: regular rate and rhythm Abdomen: soft, non-tender; no masses, no organomegaly Extremities: extremities normal, atraumatic, no cyanosis or edema Skin: Skin color, texture, turgor normal. No rashes or lesions Lymph nodes: Cervical,  supraclavicular, and axillary nodes normal. No abnormal inguinal nodes palpated Neurologic: Grossly normal  Pelvic: External genitalia:  no lesions              Urethra:  normal appearing urethra with no masses, tenderness or lesions              Bartholins and Skenes: normal                 Vagina:  Atrophy noted.               Cervix: no lesions              Pap taken: Yes.   Bimanual Exam:  Uterus:  normal size, contour, position, consistency, mobility, non-tender              Adnexa: no mass, fullness, tenderness              Rectal exam: Yes.  .  Confirms.              Anus:  normal sphincter  tone, no lesions  Chaperone was present for exam.  Assessment:   Well woman visit with normal exam. 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  Remote hx abnormal pap. Atrophy of vagina.  Declines tx.  Osteopenia. Vit D deficiency.   Plan: Mammogram screening.  She will schedule. Recommended self breast awareness. Pap and HR HPV as above. Guidelines for Calcium, Vitamin D, regular exercise program including cardiovascular and weight bearing exercise. TDap today. BMD ordered.  Routine labs. Follow up annually and prn.   After visit summary provided.

## 2018-10-03 ENCOUNTER — Other Ambulatory Visit: Payer: Self-pay

## 2018-10-03 ENCOUNTER — Encounter: Payer: Self-pay | Admitting: Obstetrics and Gynecology

## 2018-10-03 ENCOUNTER — Ambulatory Visit (INDEPENDENT_AMBULATORY_CARE_PROVIDER_SITE_OTHER): Payer: BLUE CROSS/BLUE SHIELD | Admitting: Obstetrics and Gynecology

## 2018-10-03 ENCOUNTER — Other Ambulatory Visit: Payer: Self-pay | Admitting: Obstetrics and Gynecology

## 2018-10-03 ENCOUNTER — Other Ambulatory Visit (HOSPITAL_COMMUNITY)
Admission: RE | Admit: 2018-10-03 | Discharge: 2018-10-03 | Disposition: A | Discharge status: Home / Self Care | Payer: BLUE CROSS/BLUE SHIELD | Source: Ambulatory Visit | Attending: Obstetrics and Gynecology | Admitting: Obstetrics and Gynecology

## 2018-10-03 VITALS — BP 100/58 | HR 76 | Resp 14 | Ht 64.25 in | Wt 123.4 lb

## 2018-10-03 DIAGNOSIS — Z23 Encounter for immunization: Secondary | ICD-10-CM

## 2018-10-03 DIAGNOSIS — Z1231 Encounter for screening mammogram for malignant neoplasm of breast: Secondary | ICD-10-CM

## 2018-10-03 DIAGNOSIS — Z780 Asymptomatic menopausal state: Secondary | ICD-10-CM

## 2018-10-03 DIAGNOSIS — Z01419 Encounter for gynecological examination (general) (routine) without abnormal findings: Secondary | ICD-10-CM | POA: Diagnosis not present

## 2018-10-03 DIAGNOSIS — M8580 Other specified disorders of bone density and structure, unspecified site: Secondary | ICD-10-CM | POA: Diagnosis not present

## 2018-10-03 NOTE — Patient Instructions (Signed)

## 2018-10-04 LAB — COMPREHENSIVE METABOLIC PANEL
ALBUMIN: 4.7 g/dL (ref 3.8–4.8)
ALK PHOS: 72 IU/L (ref 39–117)
ALT: 16 IU/L (ref 0–32)
AST: 26 IU/L (ref 0–40)
Albumin/Globulin Ratio: 2 (ref 1.2–2.2)
BUN/Creatinine Ratio: 22 (ref 12–28)
BUN: 15 mg/dL (ref 8–27)
Bilirubin Total: 0.6 mg/dL (ref 0.0–1.2)
CHLORIDE: 97 mmol/L (ref 96–106)
CO2: 28 mmol/L (ref 20–29)
CREATININE: 0.67 mg/dL (ref 0.57–1.00)
Calcium: 9.3 mg/dL (ref 8.7–10.3)
GFR calc non Af Amer: 95 mL/min/{1.73_m2} (ref >59)
GFR, EST AFRICAN AMERICAN: 109 mL/min/{1.73_m2} (ref >59)
GLUCOSE: 97 mg/dL (ref 65–99)
Globulin, Total: 2.3 g/dL (ref 1.5–4.5)
POTASSIUM: 4.6 mmol/L (ref 3.5–5.2)
SODIUM: 139 mmol/L (ref 134–144)
Total Protein: 7 g/dL (ref 6.0–8.5)

## 2018-10-04 LAB — CBC
HEMOGLOBIN: 12.5 g/dL (ref 11.1–15.9)
Hematocrit: 36.8 % (ref 34.0–46.6)
MCH: 33.5 pg — ABNORMAL HIGH (ref 26.6–33.0)
MCHC: 34 g/dL (ref 31.5–35.7)
MCV: 99 fL — ABNORMAL HIGH (ref 79–97)
PLATELETS: 324 10*3/uL (ref 150–450)
RBC: 3.73 x10E6/uL — ABNORMAL LOW (ref 3.77–5.28)
RDW: 12.6 % (ref 11.7–15.4)
WBC: 5.7 10*3/uL (ref 3.4–10.8)

## 2018-10-04 LAB — LIPID PANEL
CHOLESTEROL TOTAL: 175 mg/dL (ref 100–199)
Chol/HDL Ratio: 2.7 ratio (ref 0.0–4.4)
HDL: 64 mg/dL (ref >39)
LDL CALC: 97 mg/dL (ref 0–99)
Triglycerides: 68 mg/dL (ref 0–149)
VLDL Cholesterol Cal: 14 mg/dL (ref 5–40)

## 2018-10-04 LAB — TSH: TSH: 1.45 u[IU]/mL (ref 0.450–4.500)

## 2018-10-04 LAB — VITAMIN D 25 HYDROXY (VIT D DEFICIENCY, FRACTURES): Vit D, 25-Hydroxy: 19.9 ng/mL — ABNORMAL LOW (ref 30.0–100.0)

## 2018-10-06 ENCOUNTER — Encounter: Payer: Self-pay | Admitting: Obstetrics and Gynecology

## 2018-10-07 LAB — CYTOLOGY - PAP
Diagnosis: NEGATIVE
HPV: NOT DETECTED

## 2018-10-08 ENCOUNTER — Ambulatory Visit: Payer: BLUE CROSS/BLUE SHIELD | Admitting: Sports Medicine

## 2018-10-10 ENCOUNTER — Ambulatory Visit (INDEPENDENT_AMBULATORY_CARE_PROVIDER_SITE_OTHER): Payer: BLUE CROSS/BLUE SHIELD

## 2018-10-10 ENCOUNTER — Encounter: Payer: Self-pay | Admitting: Sports Medicine

## 2018-10-10 ENCOUNTER — Ambulatory Visit: Payer: Self-pay

## 2018-10-10 ENCOUNTER — Ambulatory Visit: Payer: BLUE CROSS/BLUE SHIELD | Admitting: Sports Medicine

## 2018-10-10 VITALS — BP 100/68 | HR 78 | Ht 64.25 in | Wt 123.6 lb

## 2018-10-10 DIAGNOSIS — M25552 Pain in left hip: Secondary | ICD-10-CM

## 2018-10-10 DIAGNOSIS — M25562 Pain in left knee: Secondary | ICD-10-CM

## 2018-10-10 DIAGNOSIS — G8929 Other chronic pain: Secondary | ICD-10-CM | POA: Diagnosis not present

## 2018-10-10 DIAGNOSIS — M1612 Unilateral primary osteoarthritis, left hip: Secondary | ICD-10-CM | POA: Diagnosis not present

## 2018-10-10 NOTE — Patient Instructions (Addendum)
You had an injection today.  Things to be aware of after injection are listed below: . You may experience no significant improvement or even a slight worsening in your symptoms during the first 24 to 48 hours.  After that we expect your symptoms to improve gradually over the next 2 weeks for the medicine to have its maximal effect.  You should continue to have improvement out to 6 weeks after your injection. . Dr. Rigby recommends icing the site of the injection for 20 minutes  1-2 times the day of your injection . You may shower but no swimming, tub bath or Jacuzzi for 24 hours. . If your bandage falls off this does not need to be replaced.  It is appropriate to remove the bandage after 4 hours. . You may resume light activities as tolerated unless otherwise directed per Dr. Rigby during your visit  POSSIBLE STEROID SIDE EFFECTS:  Side effects from injectable steroids tend to be less than when taken orally however you may experience some of the symptoms listed below.  If experienced these should only last for a short period of time. Change in menstrual flow  Edema (swelling)  Increased appetite Skin flushing (redness)  Skin rash/acne  Thrush (oral) Yeast vaginitis    Increased sweating  Depression Increased blood glucose levels Cramping and leg/calf  Euphoria (feeling happy)  POSSIBLE PROCEDURE SIDE EFFECTS: The side effects of the injection are usually fairly minimal however if you may experience some of the following side effects that are usually self-limited and will is off on their own.  If you are concerned please feel free to call the office with questions:  Increased numbness or tingling  Nausea or vomiting  Swelling or bruising at the injection site   Please call our office if if you experience any of the following symptoms over the next week as these can be signs of infection:   Fever greater than 100.5F  Significant swelling at the injection site  Significant redness or drainage  from the injection site  If after 2 weeks you are continuing to have worsening symptoms please call our office to discuss what the next appropriate actions should be including the potential for a return office visit or other diagnostic testing.   Also check out "Foundation Training" which is a program developed by Dr. Eric Goodman.   There are links to a couple of his YouTube Videos below and I would like to see you performing one of his videos 5-6 days per week.  It is best to do these exercises first thing in the morning.  They will give you a good jumpstart here today and start normalizing the way you move.  A good intro video is: "Independence from Pain 7-minute Video" - https://www.youtube.com/watch?v=V179hqrkFJ0   A more advanced video is: "Foundation training original 12 minutes" - https://www.youtube.com/watch?v=4BOTvaRaDjI  Exercises that focus more on the neck are as below: Dr. Goodman with [employer] teaching neck and shoulder details Part 1 - https://youtu.be/cTk8PpDogq0 Part 2 Dr. Goodman with [employer] quick routine to practice daily - https://youtu.be/Y63sa6ETT6s  Do not try to attempt the entire video when first beginning.  Try breaking of each exercise that he goes into shorter segments.  In other words, if they perform an exercise for 45 seconds, start with 15 seconds and rest and then resume when they begin the new activity.  If you work your way up to being able to do these videos without having to stop, I expect   you will see significant improvements in your pain.  If you enjoy his videos and would like to find out more you can look on his website: [email or web address].  He has a workout streaming option as well as a DVD set available for purchase.  Amazon has the best price for his DVDs.     

## 2018-10-10 NOTE — Procedures (Signed)
PROCEDURE NOTE:  Ultrasound Guided: Injection: Left hip Images were obtained and interpreted by myself, Gaspar Bidding, DO  Images have been saved and stored to PACS system. Images obtained on: GE S7 Ultrasound machine    ULTRASOUND FINDINGS:  Degenerative spurring appreciated.  Small effusion.  DESCRIPTION OF PROCEDURE:  The patient's clinical condition is marked by substantial pain and/or significant functional disability. Other conservative therapy has not provided relief, is contraindicated, or not appropriate. There is a reasonable likelihood that injection will significantly improve the patient's pain and/or functional impairment.   After discussing the risks, benefits and expected outcomes of the injection and all questions were reviewed and answered, the patient wished to undergo the above named procedure.  Verbal consent was obtained.  The ultrasound was used to identify the target structure and adjacent neurovascular structures. The skin was then prepped in sterile fashion and the target structure was injected under direct visualization using sterile technique as below:  Single injection performed as below: PREP: Alcohol and Ethel Chloride APPROACH:direct, stopcock technique, 22g 3.5 in. INJECTATE: 5 cc 1% lidocaine, 2 cc 0.5% Marcaine and 2 cc 40mg /mL DepoMedrol ASPIRATE: None DRESSING: Band-Aid  Post procedural instructions including recommending icing and warning signs for infection were reviewed.    This procedure was well tolerated and there were no complications.   IMPRESSION: Succesful Ultrasound Guided: Injection

## 2018-10-10 NOTE — Progress Notes (Signed)
Bridget Mccarthy. [facility] [phone]  Bridget Mccarthy - 63 y.o. female MRN [id number]  Date of birth: Jul 05, 1956  Visit Date: October 10, 2018  PCP: Shelva Majestic, MD   Referred by: Shelva Majestic, MD  SUBJECTIVE:   Chief Complaint  Patient presents with  . Knee Pain  . Hip Pain    HPI: Patient is here for worsening left hip, groin, low back pain that is occasionally causing some cramping in her toes even.  Her left knee is been problematic intermittently following the incident last year with her dog.  She unfortunately felt like it was getting better but has now subsequently been worsening again.  It has been worsening over the past 1 month especially into the groin.  The lateral lower leg thigh hip and groin have been having pain especially with prolonged sitting or with prolonged weightbearing.  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  She also is a avid Engineer, structural and wears fairly high heels for this.  She does sleep with a pillow between her knees and that seems to help she has been using Aleve Pennsaid topical Biofreeze without significant improvements.  REVIEW OF SYSTEMS:  She does report some nighttime disturbances but is otherwise 12 point review of systems is negative. HISTORY:  Prior history reviewed and updated per electronic medical record.  Patient Active Problem List   Diagnosis Date Noted  . Left knee pain 10/10/2018    10/10/18 XR L knee IMPRESSION: Small joint effusion. No acute bony abnormality is seen.    . Vitamin D deficiency 01/03/2018    Followed by gyn   . Former smoker 01/03/2018    Former smoker- quit 2005- 2 pack years    Social History   Occupational History  . Occupation: Flight  Attendent  Tobacco Use  . Smoking status: Former Smoker    Packs/day: 0.10    Years: 20.00    Pack years: 2.00    Last attempt to quit: 08/21/2003    Years  since quitting: 15.1  . Smokeless tobacco: Never Used  Substance and Sexual Activity  . Alcohol use: Yes    Alcohol/week: 5.0 - 7.0 standard drinks    Types: 5 - 7 Standard drinks or equivalent per week  . Drug use: No  . Sexual activity: Yes    Partners: Male    Birth control/protection: Post-menopausal   Social History   Social History Narrative   Married. Dr. Durene Cal sees her husband.       Works as Midwife   4 year college at [employer]      Hobbies: ballroom dancing twice a week    OBJECTIVE:  VS:  HT:5' 4.25" (163.2 cm)   WT:123 lb 9.6 oz (56.1 kg)  BMI:21.05    BP:100/68  HR:78bpm  TEMP: ( )  RESP:95 %   PHYSICAL EXAM: Adult female. No acute distress.  Alert and appropriate. Left hip and knee are overall well aligned.  She has very poor mobility of her left hip with marked limitations compared to the right and internal rotation, external rotation.  She has an approximately 60 degree arc on the left within normal 100 degree arc on the right.  She has a markedly positive Stinchfield test.  Mildly painful and limited logroll.  Negative straight leg raise.   ASSESSMENT:   1. Left hip pain   2.  Chronic pain of left knee   3. Primary osteoarthritis of left hip     PROCEDURES:  US Guided Injection per procedure note      PLAN:  Pertinent additional documentation may be included in corresponding procedure notes, imaging studies, problem based documentation and patient instructions.  No problem-specific Assessment & Plan notes found for this encounter.   Ultimately her left hip is likely contributing to her left knee pain.  She has fairly limited mobility and will benefit from intra-articular injection today.  I believe this is more functional and she will respond with conservative measures but we did discuss that total hip arthroplasty is an option for down the road if any lack of improvement.  Her hip flexors are markedly tight and  osteopathic manipulation can be considered and follow-up if any lack of improvement.  Links to [employer] provided today per Patient Instructions.  These exercises were developed by Myles Lipps, DC with a strong emphasis on core neuromuscular reducation and postural realignment through body-weight exercises.   Discussed the underlying features of tight hip flexors leading to crouched, fetal like position that results in spinal column compression.  Including lumbar hyperflexion with hypermobility, thoracic flexion with restrictive rotation and cervical lordosis reversal.   Activity modifications and the importance of avoiding exacerbating activities (limiting pain to no more than a 4 / 10 during or following activity) recommended and discussed.  Discussed red flag symptoms that warrant earlier emergent evaluation and patient voices understanding.   No orders of the defined types were placed in this encounter.  Lab Orders  No laboratory test(s) ordered today    Imaging Orders     DG HIP UNILAT W OR W/O PELVIS 2-3 VIEWS LEFT     [place] [facility] POCT ULTRASOUND Referral Orders  No referral(s) requested today    Return in about 4 weeks (around 11/07/2018).          Andrena Mews, DO    [facility]

## 2018-11-07 ENCOUNTER — Inpatient Hospital Stay: Admission: RE | Admit: 2018-11-07 | Payer: BLUE CROSS/BLUE SHIELD | Source: Ambulatory Visit

## 2018-11-07 ENCOUNTER — Ambulatory Visit: Payer: BLUE CROSS/BLUE SHIELD | Admitting: Sports Medicine

## 2018-11-10 ENCOUNTER — Ambulatory Visit: Payer: BLUE CROSS/BLUE SHIELD | Admitting: Sports Medicine

## 2018-11-27 ENCOUNTER — Other Ambulatory Visit: Payer: BLUE CROSS/BLUE SHIELD

## 2018-12-16 ENCOUNTER — Ambulatory Visit: Payer: BLUE CROSS/BLUE SHIELD

## 2019-01-08 ENCOUNTER — Encounter: Payer: BLUE CROSS/BLUE SHIELD | Admitting: Family Medicine

## 2019-01-15 ENCOUNTER — Other Ambulatory Visit: Payer: Self-pay

## 2019-01-15 ENCOUNTER — Ambulatory Visit
Admission: RE | Admit: 2019-01-15 | Discharge: 2019-01-15 | Disposition: A | Discharge status: Home / Self Care | Payer: BLUE CROSS/BLUE SHIELD | Source: Ambulatory Visit | Attending: Obstetrics and Gynecology | Admitting: Obstetrics and Gynecology

## 2019-01-15 DIAGNOSIS — Z1231 Encounter for screening mammogram for malignant neoplasm of breast: Secondary | ICD-10-CM

## 2019-01-19 ENCOUNTER — Other Ambulatory Visit: Payer: Self-pay | Admitting: *Deleted

## 2019-01-19 DIAGNOSIS — T8543XS Leakage of breast prosthesis and implant, sequela: Secondary | ICD-10-CM

## 2019-01-19 DIAGNOSIS — Z978 Presence of other specified devices: Secondary | ICD-10-CM

## 2019-02-06 ENCOUNTER — Other Ambulatory Visit: Payer: Self-pay

## 2019-02-06 ENCOUNTER — Ambulatory Visit
Admission: RE | Admit: 2019-02-06 | Discharge: 2019-02-06 | Disposition: A | Discharge status: Home / Self Care | Payer: BC Managed Care – PPO | Source: Ambulatory Visit | Attending: Obstetrics and Gynecology | Admitting: Obstetrics and Gynecology

## 2019-02-06 DIAGNOSIS — M8580 Other specified disorders of bone density and structure, unspecified site: Secondary | ICD-10-CM

## 2019-02-06 DIAGNOSIS — Z780 Asymptomatic menopausal state: Secondary | ICD-10-CM

## 2019-02-10 ENCOUNTER — Other Ambulatory Visit: Payer: Self-pay | Admitting: Obstetrics and Gynecology

## 2019-02-10 ENCOUNTER — Other Ambulatory Visit: Payer: Self-pay

## 2019-02-10 ENCOUNTER — Ambulatory Visit
Admission: RE | Admit: 2019-02-10 | Discharge: 2019-02-10 | Disposition: A | Discharge status: Home / Self Care | Payer: BC Managed Care – PPO | Source: Ambulatory Visit | Attending: Obstetrics and Gynecology | Admitting: Obstetrics and Gynecology

## 2019-02-10 DIAGNOSIS — T8543XS Leakage of breast prosthesis and implant, sequela: Secondary | ICD-10-CM

## 2019-03-10 ENCOUNTER — Encounter: Payer: Self-pay | Admitting: Family Medicine

## 2019-03-10 NOTE — Progress Notes (Signed)
Phone: [phone]   Subjective:  Patient presents today for their annual physical. Chief complaint-noted.   See problem oriented charting- ROS- full  review of systems was completed and negative except for: painful intercourse. No chest pain or shortness of breath. No headache or blurry vision.   The following were reviewed and entered/updated in epic: Past Medical History:  Diagnosis Date  . Chicken pox   . Low vitamin D level 2019  . Osteopenia 06/03/2008  . SYMPTOMATIC MENOPAUSAL/FEMALE CLIMACTERIC STATES 01/15/2008  . Undiagnosed cardiac murmurs 01/15/2008   Was told by prior GI doctor that she had a murmur. hasnt been heard by other providers  . UTI (urinary tract infection)    Patient Active Problem List   Diagnosis Date Noted  . Vitamin D deficiency 01/03/2018    Priority: Low  . Former smoker 01/03/2018    Priority: Low  . Left knee pain 10/10/2018   Past Surgical History:  Procedure Laterality Date  . BREAST SURGERY  1988   augmentation--Replaced 2013    Family History  Problem Relation Age of Onset  . Cancer Father        unknown origin/cause--he was an alcoholic  . Alcohol abuse Father        estranged from her life  . Other Mother        Dec 67 with complications from Shy-Drager Syndrome  . Alcohol abuse Maternal Grandmother   . Lung cancer Maternal Grandfather        heavy smoker  . Other Paternal Grandmother        she died after father's birth    Medications- reviewed and updated Current Outpatient Medications  Medication Sig Dispense Refill  . naproxen sodium (ALEVE) 220 MG tablet Take 220 mg by mouth.     No current facility-administered medications for this visit.     Allergies-reviewed and updated Allergies  Allergen Reactions  . Shrimp [Shellfish Allergy]     Throat swelling- likely anaphylaxis. Didn't have to take benadryl or otherthough and resolved    Social History   Social History Narrative   Married. Dr. Durene CalHunter sees her  husband.       Works as Midwifeflight attendant- [employer]   4 year college at [employer]      Hobbies: ballroom dancing twice a week   Objective  Objective:  BP 100/60   Pulse 68   Temp 98.2 F (36.8 C) (Oral)   Ht 5\' 4"  (1.626 m)   Wt 125 lb (56.7 kg)   LMP 08/20/2008   SpO2 99%   BMI 21.46 kg/m  Gen: NAD, resting comfortably HEENT: Mucous membranes are moist. Oropharynx normal Neck: no thyromegaly CV: RRR no murmurs rubs or gallops Lungs: CTAB no crackles, wheeze, rhonchi Abdomen: soft/nontender/nondistended/normal bowel sounds. No rebound or guarding.  Ext: no edema Skin: warm, dry Neuro: grossly normal, moves all extremities, PERRLA   Assessment and Plan    63 y.o. female presenting for annual physical.  Health Maintenance counseling: 1. Anticipatory guidance: Patient counseled regarding regular dental exams -q6 months, eye exams - yearly,  avoiding smoking and second hand smoke (husband smokes outside) , limiting alcohol to 1 beverage per day .   2. Risk factor reduction:  Advised patient of need for regular exercise and diet rich and fruits and vegetables to reduce risk of heart attack and stroke. Exercise- mostly 5 days a week . Diet- reasonably healthy diet- some increase calories with covid 19. .  Wt Readings from Last 3  Encounters:  03/11/19 125 lb (56.7 kg)  10/10/18 123 lb 9.6 oz (56.1 kg)  10/03/18 123 lb 6.4 oz (56 kg)  3. Immunizations/screenings/ancillary studies- defer shingrix with covid 19 Immunization History  Administered Date(s) Administered  . Td 01/15/2008  . Tdap 10/03/2018  4. Cervical cancer screening- pap 2020 with 3 year repeat with Dr. Quincy Simmonds 5. Breast cancer screening-  breast exam with GYN and mammogram 01/15/2019 and see discussion about MRI 6. Colon cancer screening - 10/07/15 with 10 year follow up 7. Skin cancer screening- no dermatologist. advised regular sunscreen use. Denies worrisome, changing, or new skin lesions.  8. Birth  control/STD check- postmenopausal/monogomous 9. Osteoporosis screening at 53- follows with GYN for osteopenia. Recent bone density 10. former smoker- quit 2005 after 2 pack years- no regular screening needed.   Status of chronic or acute concerns   #social update- has taken a leave from work- last trip was in march with [employer]. She is leaning toward returning to work but after 15 month leave. She would only want to return for international travel -doing some sewing- helping with masks/shields -considering woodworking  #Free silicone-patient with history of breast reconstruction with Dr. Marica Otter- 2nd surgery.  Unfortunately MRI shows through GYN "Significant free silicone involving the LATERAL aspect of the RIGHT breast (both UPPER OUTER and LOWER OUTER quadrants), and extends laterally to involve the RIGHT chest wall."  From February 10, 2019 -Recommended plastic surgeon follow-up for their opinion about potential surgery -We reviewed recommendations from up-to-date with suggested likely surgery  Vitamin D Deficiency - Last checked 10/03/18 and was 19.9 ng/mL. Dr. Quincy Simmonds recommended OTC Vit D3 2000 IU daily intermittently- takes MV with vitamin D -Suggested with only infrequent use of vitamin D that her levels would likely be low again-recommended she consistently take this and we can repeat next year - Last MCV slightly high-can also check B12 next year  Left t knee pain - after knee injury never really got back to normal. Later ended up with left hip pain- saw Dr. Paulla Fore and diagnosed with arthritis- was told down line may need replacement.   Working around painful intercourse issues    Recommended follow up: 1 year physical recommended Future Appointments  Date Time Provider [place]  10/09/2019 10:00 AM Nunzio Cobbs, MD [place] None  03/11/2020 11:00 AM Marin Olp, MD [facility]   Lab/Order associations: Lipids checked in February- was normal. No labs needed  at present- did not repeat today   ICD-10-CM   1. Preventative health care  Z00.00   2. Vitamin D deficiency  E55.9    Return precautions advised.  Garret Reddish, MD

## 2019-03-10 NOTE — Patient Instructions (Addendum)
Had extensive labs in February- I dont see need for repeat but do want you to take vitamin D and multivitamin consistently  I think you are doing great! Keep up the good work

## 2019-03-11 ENCOUNTER — Other Ambulatory Visit: Payer: Self-pay

## 2019-03-11 ENCOUNTER — Encounter: Payer: Self-pay | Admitting: Family Medicine

## 2019-03-11 ENCOUNTER — Ambulatory Visit (INDEPENDENT_AMBULATORY_CARE_PROVIDER_SITE_OTHER): Payer: BC Managed Care – PPO | Admitting: Family Medicine

## 2019-03-11 VITALS — BP 100/60 | HR 68 | Temp 98.2°F | Ht 64.0 in | Wt 125.0 lb

## 2019-03-11 DIAGNOSIS — Z0000 Encounter for general adult medical examination without abnormal findings: Secondary | ICD-10-CM

## 2019-03-11 DIAGNOSIS — E559 Vitamin D deficiency, unspecified: Secondary | ICD-10-CM | POA: Diagnosis not present

## 2019-03-26 ENCOUNTER — Ambulatory Visit: Payer: Self-pay

## 2019-03-26 NOTE — Telephone Encounter (Signed)
Patient called and says yesterday morning she woke up dizzy. She says she had to lay in the bed for about 1.5 hours, then it passed. She says today while she was in the grocery store, her left eye around the outer side she saw what looked like a crescent moon shape and like looking through a kaleidescope with moving objects. She says her vision was also blurred, no pain and this lasted for about 20 minutes, no dizziness during this episode. She says she drove home and told her husband and he suggested she call Dr. Yong Channel for advice. Right now she denies vision problems and can see fine. She says she is not dizzy and was only dizzy yesterday morning. She says she does have a slight headache, like a sinus headache or a tension headache, denies numbness, tingling. I advised I will send this to Dr. Yong Channel for review and someone will call back with his recommendation when the office opens, care advice given, advised if symptoms happen again to go to the ED or UC for evaluation, she verbalized understanding.  Reason for Disposition . [1] Brief (now gone) blurred vision AND [2] unexplained  Answer Assessment - Initial Assessment Questions 1. DESCRIPTION: "What is the vision loss like? Describe it for me." (e.g., complete vision loss, blurred vision, double vision, floaters, etc.)     Blurry, movement in the crescent moon shape 2. LOCATION: "One or both eyes?" If one, ask: "Which eye?"     Left eye 3. SEVERITY: "Can you see anything?" If so, ask: "What can you see?" (e.g., fine print)     No vision changes, I can see 4. ONSET: "When did this begin?" "Did it start suddenly or has this been gradual?"     Today 5. PATTERN: "Does this come and go, or has it been constant since it started?"     All of a sudden, now gone 6. PAIN: "Is there any pain in your eye(s)?"  (Scale 1-10; or mild, moderate, severe)     No 7. CONTACTS-GLASSES: "Do you wear contacts or glasses?"     No 8. CAUSE: "What do you think is  causing this visual problem?"     I don't know 9. OTHER SYMPTOMS: "Do you have any other symptoms?" (e.g., confusion, headache, arm or leg weakness, speech problems)     Dizziness yesterday, slight headache now 10. PREGNANCY: "Is there any chance you are pregnant?" "When was your last menstrual period?"       No  Protocols used: [place]

## 2019-03-27 ENCOUNTER — Encounter: Payer: Self-pay | Admitting: Family Medicine

## 2019-03-27 ENCOUNTER — Other Ambulatory Visit: Payer: Self-pay

## 2019-03-27 ENCOUNTER — Ambulatory Visit: Payer: BC Managed Care – PPO | Admitting: Family Medicine

## 2019-03-27 VITALS — BP 100/70 | HR 76 | Temp 98.4°F | Ht 64.0 in | Wt 125.2 lb

## 2019-03-27 DIAGNOSIS — R42 Dizziness and giddiness: Secondary | ICD-10-CM

## 2019-03-27 DIAGNOSIS — H53412 Scotoma involving central area, left eye: Secondary | ICD-10-CM | POA: Diagnosis not present

## 2019-03-27 DIAGNOSIS — H43393 Other vitreous opacities, bilateral: Secondary | ICD-10-CM | POA: Diagnosis not present

## 2019-03-27 NOTE — Progress Notes (Signed)
Phone [phone]   Subjective:  Bridget Mccarthy is a 63 y.o. year old very pleasant female patient who presents for/with See problem oriented charting Chief Complaint  Patient presents with  . Migraine   ROS- No facial or extremity weakness. No slurred words or trouble swallowing. no blurry vision (other than episode for 20 minutes yesterday) or double vision. No paresthesias. No confusion or word finding difficulties. Did have vertigo 1.5 hours on Wednesday morning.   Past Medical History-  Patient Active Problem List   Diagnosis Date Noted  . Vitamin D deficiency 01/03/2018    Priority: Low  . Former smoker 01/03/2018    Priority: Low  . Left knee pain 10/10/2018    Medications- reviewed and updated Current Outpatient Medications  Medication Sig Dispense Refill  . naproxen sodium (ALEVE) 220 MG tablet Take 220 mg by mouth.     No current facility-administered medications for this visit.      Objective:  BP 100/70 (BP Location: Left Arm, Patient Position: Sitting, Cuff Size: Normal)   Pulse 76   Temp 98.4 F (36.9 C) (Oral)   Ht 5\' 4"  (1.626 m)   Wt 125 lb 3.2 oz (56.8 kg)   LMP 08/20/2008   SpO2 97%   BMI 21.49 kg/m  Gen: NAD, resting comfortably CV: RRR  Lungs: nonlabored, normal respiratory rate Abdomen: soft/nondistended Ext: no edema Skin: warm, dry Neuro: CN II-XII intact, sensation and reflexes normal throughout, 5/5 muscle strength in bilateral upper and lower extremities. Normal finger to nose. Normal rapid alternating movements. No pronator drift. Normal romberg. Normal gait.      Assessment and Plan    #Visual scotoma/floaters/vertigo S:C/o vertigo when waking up Wednesday morning- no issues since that time. Had gotten up to workout virtually- woke up and felt really dizzy and lasted almost 1.5 hours- would look at tv stationary and felt like room was moving. In middle of Tuesday night felt like either had a dream or had really sharp pain in  left ear. Issues happened while stationary. Has not been sick in last few weeks. No hearing loss or ringing in the ears.   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   Then yesterday,  Started noticing visual changes,crescent moon shape and "liquid kaleidescope" vision with symptoms  in left eye only (no issues in right eye). Denies sensitivity to light or sound. No hx of migraine.   Denies pain in the eyes. Vision issues total lasted 20 minutes- had very mild headache at end of this- no recurrence since that time and did not take anything for the headache.  Also has some floaters and feels like they may have worsened within the year- and in last 6 months also worsened.   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 Nicki Reaper, scheduled for next month. No sx today. Has seasonal allergies with sinus.   A/P: Vertigo episode of unclear etiology-glad this resolved.  Unclear etiology-not clearly BPPV as occurred at rest.  Her neurological exam is reassuring today and I doubt stroke as cause of her symptoms.  She will continue to monitor and follow-up if needed.  No tinnitus or hearing loss to suggest Mnire's.  Possible TIA but limited risk factors.  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  Recommended  she follow-up with her optometrist for his opinion early next week instead of waiting until next month-she agrees to seek care over the weekend if issues recurrent.  She also has worsening floaters over the last 6 to 12 months so I think an updated exam with dilation makes sense.  Finally even though the issue was in her left eye-she has some diminished vision in the right eye at 20/50 while left is 20/20- yet another indication for updated eye exam-if she was not established with optometry  would likely place urgent referral to ophthalmology-we discussed may ultimately need ophthalmology referral as well.  With current resolution of all symptoms other than vision loss in the right eye- did not think MRI of the brain was needed  Recommended follow up: Already planned for physical next year-can see us sooner for new or worsening symptoms Future Appointments  Date Time Provider Department Center  10/09/2019 10:00 AM Patton SallesAmundson C Silva, Brook E, MD [facility] None  03/11/2020 11:00 AM Shelva MajesticHunter, Stephen O, MD [facility]   Lab/Order associations:   ICD-10-CM   1. Visual field scotoma of left eye  H53.412   2. Vitreous floaters of both eyes  H43.393   3. Vertigo  R42    Return precautions advised.  Tana ConchStephen Hunter, MD

## 2019-03-27 NOTE — Telephone Encounter (Signed)
Please advise 

## 2019-03-27 NOTE — Telephone Encounter (Signed)
Called pt and scheduled her for today at 10. No further action needed at this time.  FYI

## 2019-03-27 NOTE — Patient Instructions (Addendum)
Health Maintenance Due  Topic Date Due  . INFLUENZA VACCINE  03/21/2019  We should have flu shots available by September. Please strongly consider getting flu shot this year. If you get your flu shot at a pharmacy- please let us know.   Suspect ocular migraine but with worsening floaters and possible vision decrease in right eye- really want you to be seen sooner thana month by Dr. Nicki Reaper- see if you can be seen by early next week  If you have recurrent vision issues- seek care over weekend  Not sure what caused vertigo but glad it resolved- no obvious stroke on exam or history

## 2019-04-24 IMAGING — DX DG KNEE AP/LAT W/ SUNRISE*L*
3 series · 3 of 3 positions shown · non-contrast
Comparison: None.

CLINICAL DATA: Lateral knee pain following fall yesterday, initial
encounter

EXAM:
LEFT KNEE 3 VIEWS

[knee standing ap]
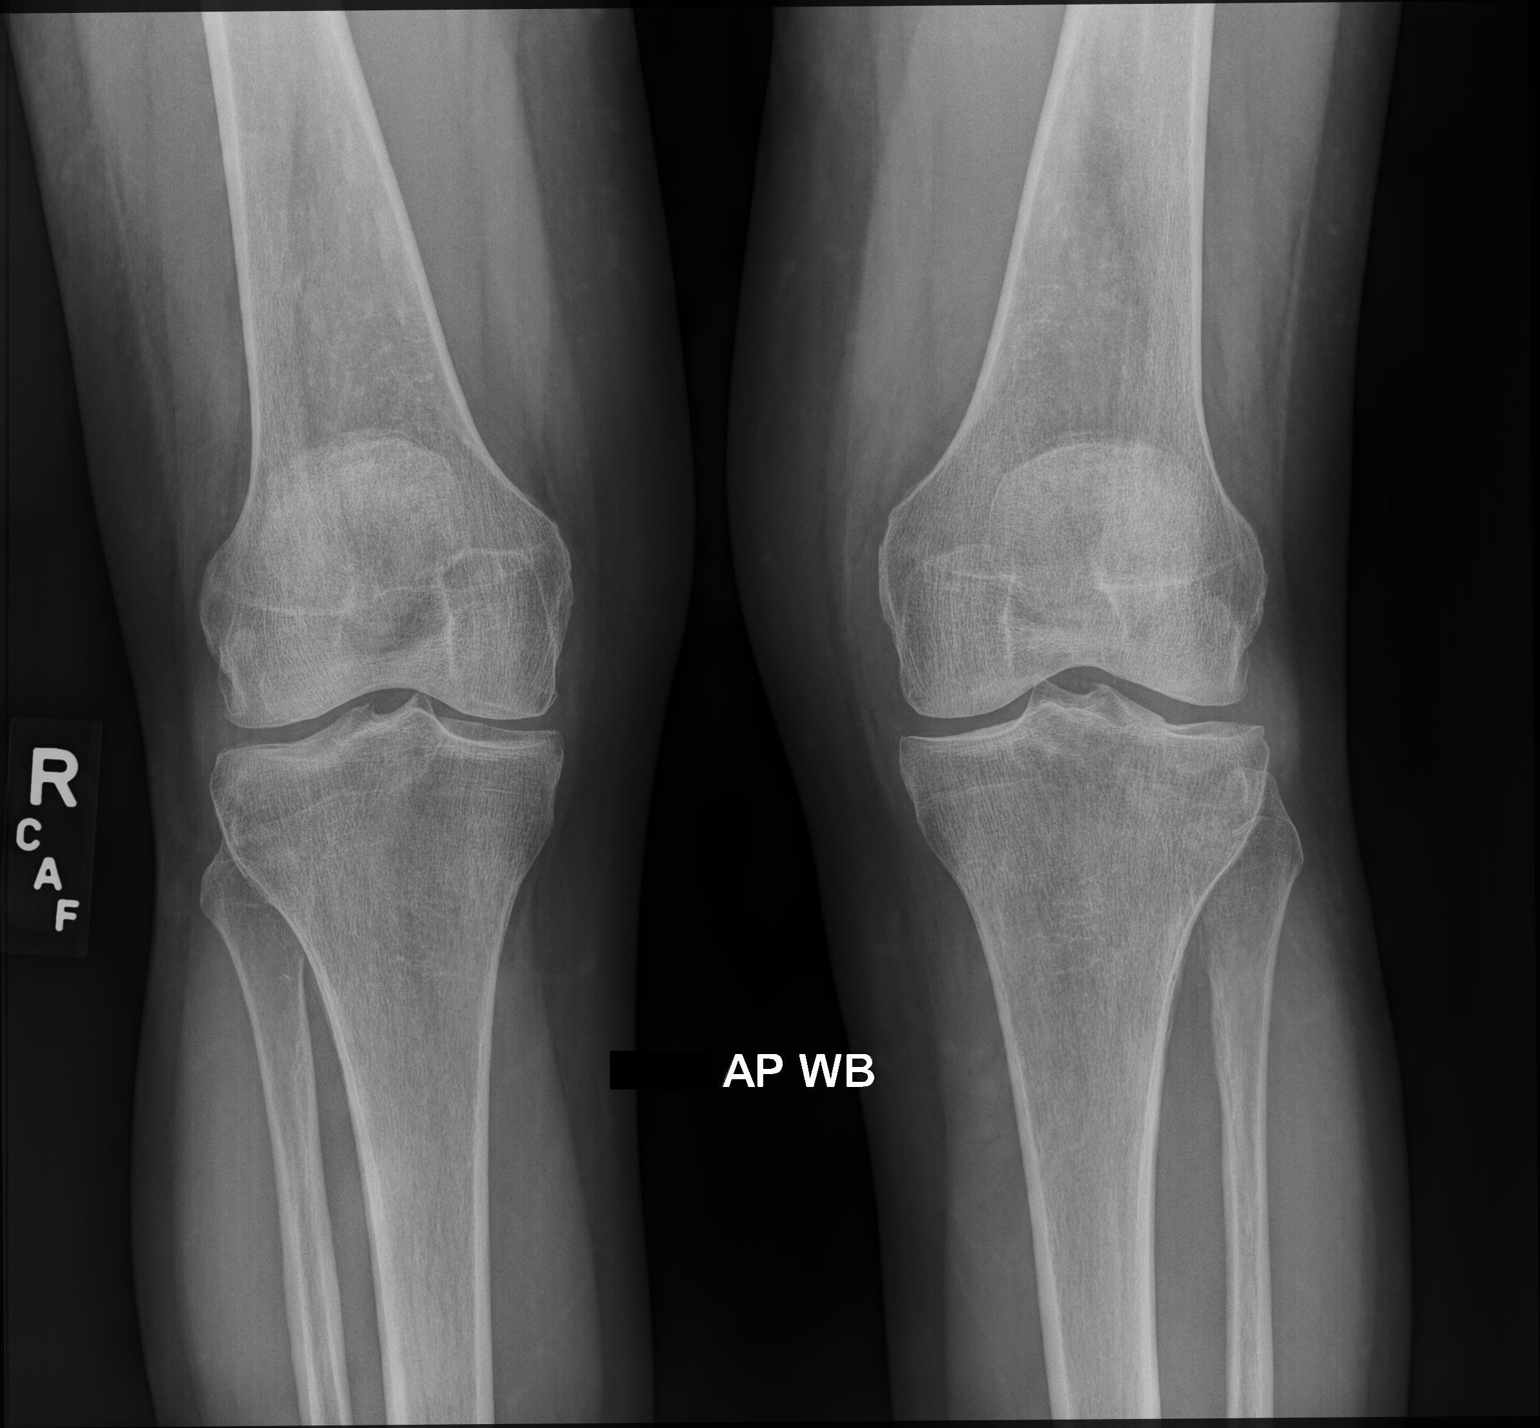

[knee standing lat]
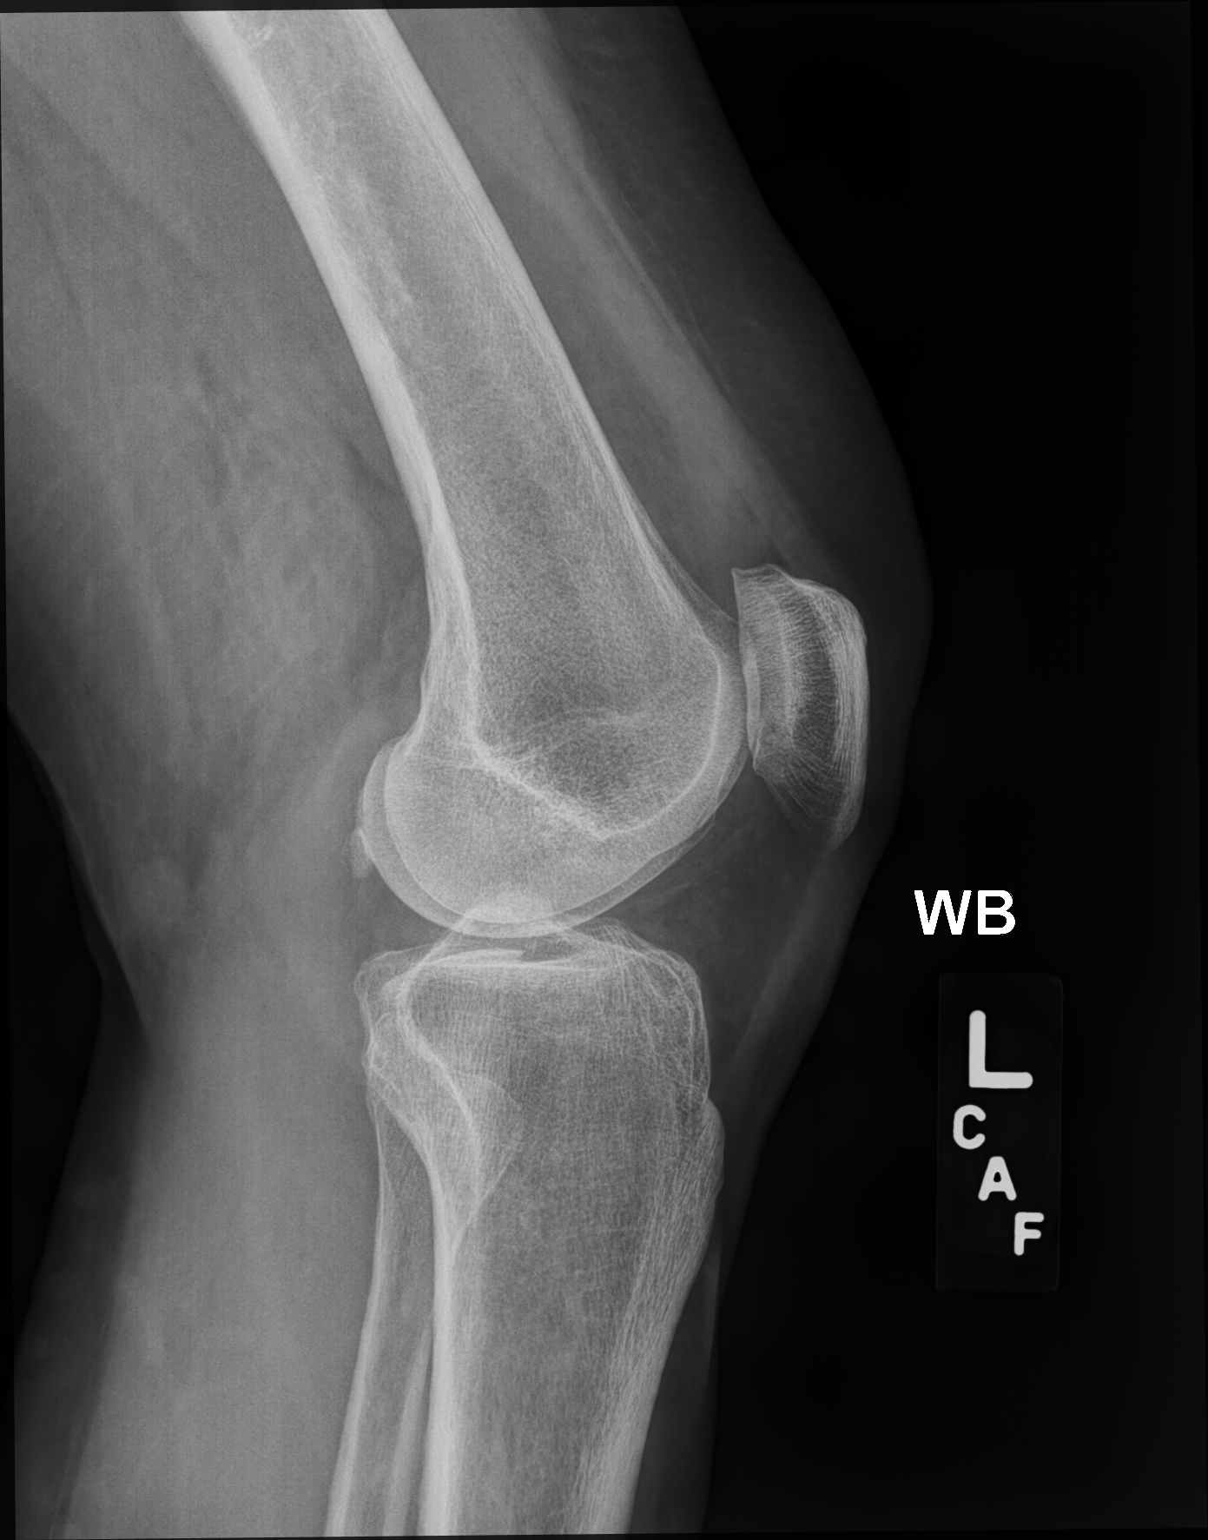

[sunrise]
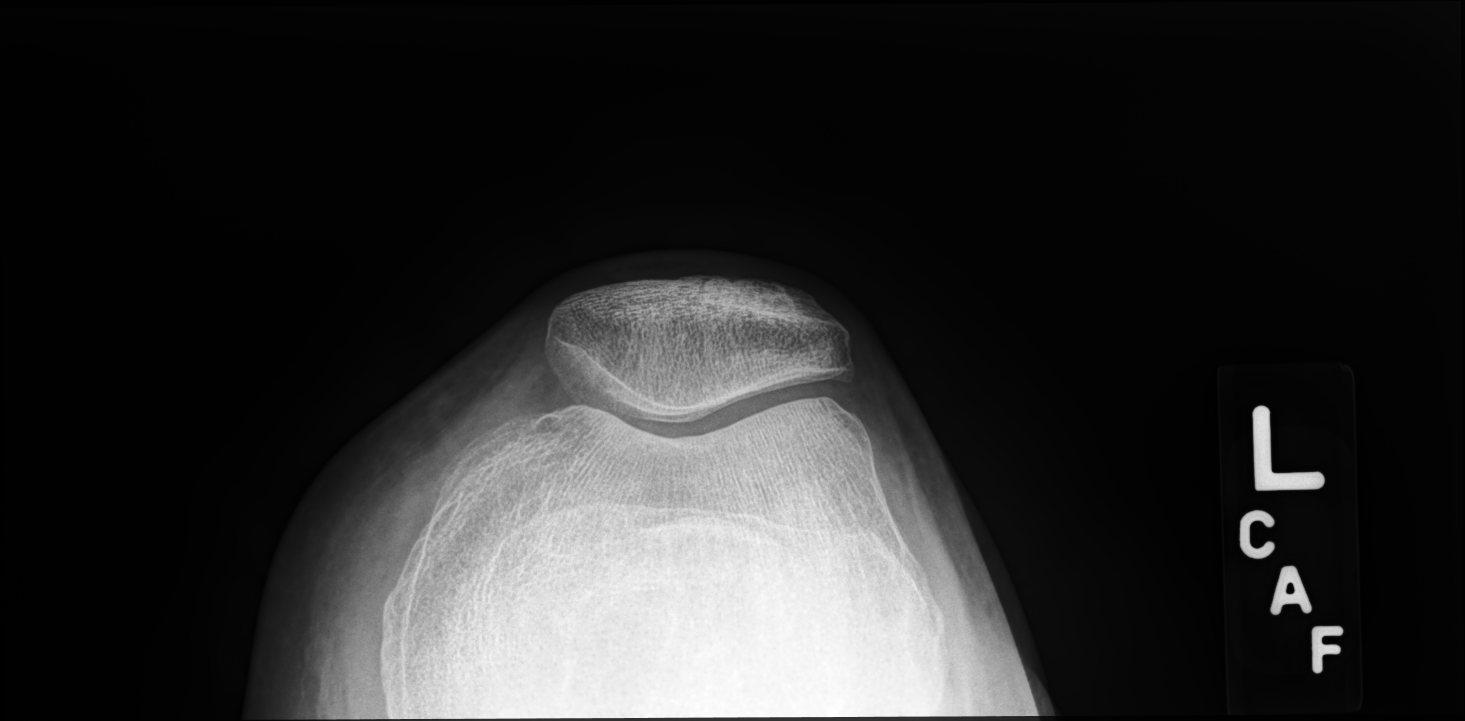

[3 of 3 positions shown; findings below may reference images not displayed]

FINDINGS: Minimal joint effusion is noted. No acute fracture or dislocation is
seen. No other focal abnormality is noted.
IMPRESSION: Small joint effusion.  No acute bony abnormality is seen.

## 2019-06-04 ENCOUNTER — Other Ambulatory Visit: Payer: Self-pay

## 2019-06-04 ENCOUNTER — Encounter: Payer: Self-pay | Admitting: Family Medicine

## 2019-06-04 ENCOUNTER — Ambulatory Visit (INDEPENDENT_AMBULATORY_CARE_PROVIDER_SITE_OTHER): Payer: BC Managed Care – PPO

## 2019-06-04 DIAGNOSIS — Z23 Encounter for immunization: Secondary | ICD-10-CM | POA: Diagnosis not present

## 2019-10-01 ENCOUNTER — Other Ambulatory Visit: Payer: Self-pay

## 2019-10-01 NOTE — Progress Notes (Signed)
64 y.o. G0P0000 Married Caucasian female here for annual exam.    Patient states after she works out, particularly after doing "squats", she has pain inside of vagina for at least one month. Pain is dull and not long lasting.  Not a deep pain.  This is similar to the pain she has with intercourse. No vaginal bleeding.   Had allergies and ear ache about a month ago.  She took a decongestant.  She feels like she has water in her ear.  Has an appointment with PCP in May.   PCP: Tana Conch, MD     Patient's last menstrual period was 08/20/2008.           Sexually active: Yes.    The current method of family planning is post menopausal status.    Exercising: Yes.    weights, cardio and ballroom dance  Smoker:  no  Health Maintenance: Pap: 10-03-18 Neg:Neg HR HPV,09-01-15 Neg:Neg HR HPV History of abnormal Pap:  Yes, Hx of cryotherapy 20 years ago MMG: 02-10-19 Bil.MRI/density C/Free silicone in Rt.Br.,suspect remote implant rupture;Lt.Br.implant intact. 01-15-19 3D/poss.Rt.Br.implant rupture/Neg/consider MRI/BiRads2 Colonoscopy: 2017 normal;next 5 years BMD:  02/06/19  Result :Osteopenia. Spine is -2.0. Hip is -2.2.. TDaP:  10-03-18 Gardasil:   no HIV: 09-01-15 NR Hep C:09-01-15 Neg Screening Labs:  today Flu vaccine:  Completed.    reports that she quit smoking about 16 years ago. She has a 2.00 pack-year smoking history. She has never used smokeless tobacco. She reports current alcohol use of about 5.0 - 7.0 standard drinks of alcohol per week. She reports that she does not use drugs.  Past Medical History:  Diagnosis Date  . Chicken pox   . Low vitamin D level 2019  . Osteopenia 06/03/2008  . SYMPTOMATIC MENOPAUSAL/FEMALE CLIMACTERIC STATES 01/15/2008  . Undiagnosed cardiac murmurs 01/15/2008   Was told by prior GI doctor that she had a murmur. hasnt been heard by other providers  . UTI (urinary tract infection)     Past Surgical History:  Procedure Laterality Date  .  BREAST SURGERY  1988   augmentation--Replaced 2013    No current outpatient medications on file.   No current facility-administered medications for this visit.    Family History  Problem Relation Age of Onset  . Cancer Father        unknown origin/cause--he was an alcoholic  . Alcohol abuse Father        estranged from her life  . Other Mother        Dec 67 with complications from Shy-Drager Syndrome  . Alcohol abuse Maternal Grandmother   . Lung cancer Maternal Grandfather        heavy smoker  . Other Paternal Grandmother        she died after father's birth    Review of Systems  All other systems reviewed and are negative.   Exam:   BP 112/68   Pulse 90   Temp (!) 97.1 F (36.2 C) (Temporal)   Resp 16   Ht 5\' 4"  (1.626 m)   Wt 127 lb (57.6 kg)   LMP 08/20/2008   BMI 21.80 kg/m     General appearance: alert, cooperative and appears stated age Head: normocephalic, without obvious abnormality, atraumatic Neck: no adenopathy, supple, symmetrical, trachea midline and thyroid normal to inspection and palpation Lungs: clear to auscultation bilaterally Breasts: bilateral implants, no masses or tenderness, No nipple retraction or dimpling, No nipple discharge or bleeding, No axillary adenopathy Heart: regular rate  and rhythm Abdomen: soft, non-tender; no masses, no organomegaly Extremities: extremities normal, atraumatic, no cyanosis or edema Skin: skin color, texture, turgor normal. No rashes or lesions Lymph nodes: cervical, supraclavicular, and axillary nodes normal. Neurologic: grossly normal  Pelvic: External genitalia:  no lesions              No abnormal inguinal nodes palpated.              Urethra:  normal appearing urethra with no masses, tenderness or lesions              Bartholins and Skenes: normal                 Vagina: erythema of the mucosa with orange discharge.              Cervix: no lesions              Pap taken: No. Bimanual Exam:  Uterus:   normal size, contour, position, consistency, mobility, non-tender              Adnexa: no mass, fullness, tenderness              Rectal exam: Yes.  .  Confirms.              Anus:  normal sphincter tone, no lesions  Chaperone was present for exam.  Assessment:   Well woman visit with normal exam. 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  Remote hx abnormal pap. Atrophy of vagina.    Osteopenia. Vit D deficiency. Ear congestion.  Plan: Mammogram screening discussed. Pap and HR HPV as above.   Guidelines for Calcium, Vitamin D, regular exercise program including cardiovascular and weight bearing exercise. We discussed vaginal estrogen treatments.  She will try vaginal estrogen cream.  Instructed in used.  We discussed potential effect on breast cancer.  Routine labs.  She will see her PCP regarding her ear congestion. Follow up annually and prn.

## 2019-10-05 ENCOUNTER — Encounter: Payer: Self-pay | Admitting: Obstetrics and Gynecology

## 2019-10-05 ENCOUNTER — Other Ambulatory Visit: Payer: Self-pay

## 2019-10-05 ENCOUNTER — Ambulatory Visit: Payer: BC Managed Care – PPO | Admitting: Obstetrics and Gynecology

## 2019-10-05 VITALS — BP 112/68 | HR 90 | Temp 97.1°F | Resp 16 | Ht 64.0 in | Wt 127.0 lb

## 2019-10-05 DIAGNOSIS — Z01419 Encounter for gynecological examination (general) (routine) without abnormal findings: Secondary | ICD-10-CM

## 2019-10-05 MED ORDER — ESTRADIOL 0.1 MG/GM VA CREA: TOPICAL_CREAM | VAGINAL | 2 refills | Status: DC

## 2019-10-05 NOTE — Patient Instructions (Signed)

## 2019-10-06 LAB — LIPID PANEL
Chol/HDL Ratio: 2.6 ratio (ref 0.0–4.4)
Cholesterol, Total: 176 mg/dL (ref 100–199)
HDL: 67 mg/dL (ref >39)
LDL Chol Calc (NIH): 98 mg/dL (ref 0–99)
Triglycerides: 56 mg/dL (ref 0–149)
VLDL Cholesterol Cal: 11 mg/dL (ref 5–40)

## 2019-10-06 LAB — COMPREHENSIVE METABOLIC PANEL
ALT: 18 IU/L (ref 0–32)
AST: 21 IU/L (ref 0–40)
Albumin/Globulin Ratio: 2 (ref 1.2–2.2)
Albumin: 4.8 g/dL (ref 3.8–4.8)
Alkaline Phosphatase: 84 IU/L (ref 39–117)
BUN/Creatinine Ratio: 19 (ref 12–28)
BUN: 12 mg/dL (ref 8–27)
Bilirubin Total: 0.5 mg/dL (ref 0.0–1.2)
CO2: 29 mmol/L (ref 20–29)
Calcium: 9.8 mg/dL (ref 8.7–10.3)
Chloride: 99 mmol/L (ref 96–106)
Creatinine, Ser: 0.64 mg/dL (ref 0.57–1.00)
GFR calc Af Amer: 110 mL/min/{1.73_m2} (ref >59)
GFR calc non Af Amer: 95 mL/min/{1.73_m2} (ref >59)
Globulin, Total: 2.4 g/dL (ref 1.5–4.5)
Glucose: 95 mg/dL (ref 65–99)
Potassium: 3.9 mmol/L (ref 3.5–5.2)
Sodium: 142 mmol/L (ref 134–144)
Total Protein: 7.2 g/dL (ref 6.0–8.5)

## 2019-10-06 LAB — CBC
Hematocrit: 39.7 % (ref 34.0–46.6)
Hemoglobin: 13.2 g/dL (ref 11.1–15.9)
MCH: 32.9 pg (ref 26.6–33.0)
MCHC: 33.2 g/dL (ref 31.5–35.7)
MCV: 99 fL — ABNORMAL HIGH (ref 79–97)
Platelets: 308 10*3/uL (ref 150–450)
RBC: 4.01 x10E6/uL (ref 3.77–5.28)
RDW: 12.3 % (ref 11.7–15.4)
WBC: 4.8 10*3/uL (ref 3.4–10.8)

## 2019-10-06 LAB — VITAMIN D 25 HYDROXY (VIT D DEFICIENCY, FRACTURES): Vit D, 25-Hydroxy: 50.2 ng/mL (ref 30.0–100.0)

## 2019-10-09 ENCOUNTER — Ambulatory Visit: Payer: BLUE CROSS/BLUE SHIELD | Admitting: Obstetrics and Gynecology

## 2019-10-16 ENCOUNTER — Other Ambulatory Visit: Payer: Self-pay

## 2019-10-16 ENCOUNTER — Telehealth (INDEPENDENT_AMBULATORY_CARE_PROVIDER_SITE_OTHER): Payer: BC Managed Care – PPO | Admitting: Family Medicine

## 2019-10-16 DIAGNOSIS — H9201 Otalgia, right ear: Secondary | ICD-10-CM | POA: Diagnosis not present

## 2019-10-16 MED ORDER — AMOXICILLIN 875 MG PO TABS: 875.0000 mg | ORAL_TABLET | Freq: Two times a day (BID) | ORAL | 0 refills | Status: DC

## 2019-10-16 NOTE — Progress Notes (Signed)
   Bridget Mccarthy is a 64 y.o. female who presents today for a virtual office visit.  Assessment/Plan:  New/Acute Problems: Ear pain Discussed limitations of virtual exam and inability to perform physical exam.  Likely has ear infection based on history.  No red flag signs or symptoms.  Will start amoxicillin.  Offered prescription for prednisone however patient declined.  Discussed reasons to return to care or seek emergent care.  Recommended she use over-the-counter decongestants and nasal sprays as she has been doing.    Subjective:  HPI:  Patient started having ear pain about 2 days ago.  Had similar symptoms about a month ago with a decongestant and symptoms improved.  Over last couple days is also had some right throat pain as well.  She has had 2 episodes of vertigo over the last day.  Both lasted for about 30 to 45 seconds and then spontaneously subsided.  Does not currently have any sort of weakness or numbness.  No fevers or chills.  No nausea or vomiting.  No current headache.         Objective/Observations  Physical Exam: Gen: NAD, resting comfortably Pulm: Normal work of breathing Neuro: Grossly normal, moves all extremities Psych: Normal affect and thought content  Virtual Visit via Video   I connected with Bridget Mccarthy on 10/16/19 at 11:00 AM EST by a video enabled telemedicine application and verified that I am speaking with the correct person using two identifiers. The limitations of evaluation and management by telemedicine and the availability of in person appointments were discussed. The patient expressed understanding and agreed to proceed.   Patient location: Home Provider location: [place] [employer] Persons participating in the virtual visit: Myself and Patient     Katina Degree. Jimmey Ralph, MD 10/16/2019 11:12 AM

## 2020-01-26 ENCOUNTER — Telehealth: Payer: Self-pay | Admitting: Family Medicine

## 2020-01-26 ENCOUNTER — Other Ambulatory Visit: Payer: Self-pay | Admitting: Obstetrics and Gynecology

## 2020-01-26 DIAGNOSIS — Z1231 Encounter for screening mammogram for malignant neoplasm of breast: Secondary | ICD-10-CM

## 2020-01-26 NOTE — Telephone Encounter (Signed)
Patient received covid vaccine Pfizer 1st dose: 10/26/19- Second dose:11/23/19

## 2020-01-26 NOTE — Telephone Encounter (Signed)
Contacted patient to reschedule physical form July to September 28th, patient would like to know if okay have blood work done on 05/16/20, due to being out the week of the 20th.

## 2020-01-26 NOTE — Telephone Encounter (Signed)
Just had largely normal labs in February with gynecology.  Too soon to repeat lipid panel.  She did have slightly low MCV-can check CBC and ferritin panel. CMP was normal as was vitamin D. Was there anything else she wanted checked? If not can order those labs for before CPE- ferritin panel and CBC under microcytosis and physical

## 2020-01-26 NOTE — Telephone Encounter (Signed)
Ok to do labs before cpe?

## 2020-01-27 ENCOUNTER — Other Ambulatory Visit: Payer: Self-pay

## 2020-01-27 ENCOUNTER — Other Ambulatory Visit: Payer: Self-pay | Admitting: Obstetrics and Gynecology

## 2020-01-27 ENCOUNTER — Ambulatory Visit
Admission: RE | Admit: 2020-01-27 | Discharge: 2020-01-27 | Disposition: A | Discharge status: Home / Self Care | Payer: BC Managed Care – PPO | Source: Ambulatory Visit

## 2020-01-27 DIAGNOSIS — Z1231 Encounter for screening mammogram for malignant neoplasm of breast: Secondary | ICD-10-CM

## 2020-01-28 ENCOUNTER — Other Ambulatory Visit: Payer: Self-pay

## 2020-01-28 NOTE — Telephone Encounter (Signed)
Called patient none of the labs were fasting so she would like to hold off and have done when in office for cpe.

## 2020-03-03 ENCOUNTER — Telehealth (INDEPENDENT_AMBULATORY_CARE_PROVIDER_SITE_OTHER): Payer: BC Managed Care – PPO | Admitting: Family Medicine

## 2020-03-03 ENCOUNTER — Encounter: Payer: Self-pay | Admitting: Family Medicine

## 2020-03-03 DIAGNOSIS — R05 Cough: Secondary | ICD-10-CM | POA: Diagnosis not present

## 2020-03-03 DIAGNOSIS — R059 Cough, unspecified: Secondary | ICD-10-CM

## 2020-03-03 MED ORDER — BENZONATATE 100 MG PO CAPS: 100.0000 mg | ORAL_CAPSULE | Freq: Two times a day (BID) | ORAL | 0 refills | Status: DC | PRN

## 2020-03-03 NOTE — Progress Notes (Signed)
Virtual Visit via Video Note  I connected with Bridget Mccarthy  on 03/03/20 at 10:40 AM EDT by a video enabled telemedicine application and verified that I am speaking with the correct person using two identifiers.  Location patient: home,  Location provider:work or home office Persons participating in the virtual visit: patient, provider, husband  I discussed the limitations of evaluation and management by telemedicine and the availability of in person appointments. The patient expressed understanding and agreed to proceed.   HPI:  Acute visit for a cough: -started about a week ago -started with sore throat and sinus congestion, went "into a cold", now feels the cough is the main symptom now, feels like a tickle in the throat, drainage in the throat -Denies: fevers, SOB, NVD, loss of taste, sick contacts, sinus pain, thick mucus -Fully vaccinated for COVID 19 with the pfizer vaccine -has a history of allergies seasonally in the spring, no history of smoking (except very remotely) or lung disease   ROS: See pertinent positives and negatives per HPI.  Past Medical History:  Diagnosis Date  . Chicken pox   . Low vitamin D level 2019  . Osteopenia 06/03/2008  . SYMPTOMATIC MENOPAUSAL/FEMALE CLIMACTERIC STATES 01/15/2008  . Undiagnosed cardiac murmurs 01/15/2008   Was told by prior GI doctor that she had a murmur. hasnt been heard by other providers  . UTI (urinary tract infection)     Past Surgical History:  Procedure Laterality Date  . BREAST SURGERY  1988   augmentation--Replaced 2013    Family History  Problem Relation Age of Onset  . Cancer Father        unknown origin/cause--he was an alcoholic  . Alcohol abuse Father        estranged from her life  . Other Mother        Dec 67 with complications from Shy-Drager Syndrome  . Alcohol abuse Maternal Grandmother   . Lung cancer Maternal Grandfather        heavy smoker  . Other Paternal Grandmother        she died after  father's birth    SOCIAL HX: see hpi   Current Outpatient Medications:  .  amoxicillin (AMOXIL) 875 MG tablet, Take 1 tablet (875 mg total) by mouth 2 (two) times daily., Disp: 20 tablet, Rfl: 0 .  benzonatate (TESSALON) 100 MG capsule, Take 1 capsule (100 mg total) by mouth 2 (two) times daily as needed for cough., Disp: 20 capsule, Rfl: 0 .  estradiol (ESTRACE) 0.1 MG/GM vaginal cream, Use 1/2 g vaginally every night for the first 2 weeks, then use 1/2 g vaginally two or three times per week as needed to maintain symptom relief., Disp: 42.5 g, Rfl: 2  EXAM:  VITALS per patient if applicable:  GENERAL: alert, oriented, appears well and in no acute distress  HEENT: atraumatic, conjunttiva clear, no obvious abnormalities on inspection of external nose and ears  NECK: normal movements of the head and neck  LUNGS: on inspection no signs of respiratory distress, breathing rate appears normal, no obvious gross SOB, gasping or wheezing  CV: no obvious cyanosis  MS: moves all visible extremities without noticeable abnormality  PSYCH/NEURO: pleasant and cooperative, no obvious depression or anxiety, speech and thought processing grossly intact  ASSESSMENT AND PLAN:  Discussed the following assessment and plan:  Cough  -we discussed possible serious and likely etiologies, options for evaluation and workup, limitations of telemedicine visit vs in person visit, treatment, treatment risks and precautions. Pt prefers  to treat via telemedicine empirically rather then risking or undertaking an in person visit at this moment. Suspect VURI with PND as main trigger for the cough. She has opted to try a short course of nasal decongestant and sent rx for tessalon for cough. Patient agrees to seek prompt in person care if worsening, new symptoms arise, or if is not improving with treatment.   I discussed the assessment and treatment plan with the patient. The patient was provided an opportunity to  ask questions and all were answered. The patient agreed with the plan and demonstrated an understanding of the instructions.   The patient was advised to call back or seek an in-person evaluation if the symptoms worsen or if the condition fails to improve as anticipated.   Terressa Koyanagi, DO

## 2020-03-11 ENCOUNTER — Encounter: Payer: BC Managed Care – PPO | Admitting: Family Medicine

## 2020-05-16 ENCOUNTER — Other Ambulatory Visit: Payer: BC Managed Care – PPO

## 2020-05-16 NOTE — Patient Instructions (Addendum)
Health Maintenance Due  Topic Date Due  . INFLUENZA VACCINE  Recommend  covid booster first at pharmacy May 24 2020 or later  and then flu shot 2-4 weeks later.  Shingrix vaccination- prioritize flu and covid- consider shingrix next year.   For pharmacy- please note due to occupational exposure potential would recommend covid 19 booster  Immunization History  Administered Date(s) Administered  . Influenza,inj,Quad PF,6+ Mos 06/04/2019  . PFIZER SARS-COV-2 Vaccination 10/26/2019, 11/23/2019  . Td 01/15/2008  . Tdap 10/03/2018    03/20/2020   Would recommend minimum of 800 units vitamin D per day due to osteopenia. Also reasonable to try to get dietary calcium closer to 1200mg .   Please stop by lab before you go If you have mychart- we will send your results within 3 business days of receiving them.  If you do not have mychart- we will call you about results within 5 business days of [place] receiving them.  *please note we are currently using [facility] which has a longer processing time than [place] typically so labs may not come back as quickly as in the past *please also note that you will see labs on mychart as soon as they post. I will later go in and write notes on them- will say "notes from Dr. [place]"

## 2020-05-16 NOTE — Progress Notes (Signed)
Phone [phone]   Subjective:  Patient presents today for their annual physical. Chief complaint-noted.   See problem oriented charting- ROS- full  review of systems was completed and negative except for: pain in right 2nd toe  The following were reviewed and entered/updated in epic: Past Medical History:  Diagnosis Date  . Chicken pox   . Low vitamin D level 2019  . Osteopenia 06/03/2008  . SYMPTOMATIC MENOPAUSAL/FEMALE CLIMACTERIC STATES 01/15/2008  . Undiagnosed cardiac murmurs 01/15/2008   Was told by prior GI doctor that she had a murmur. hasnt been heard by other providers  . UTI (urinary tract infection)    Patient Active Problem List   Diagnosis Date Noted  . Vitamin D deficiency 01/03/2018    Priority: Low  . Former smoker 01/03/2018    Priority: Low  . Left knee pain 10/10/2018   Past Surgical History:  Procedure Laterality Date  . BREAST SURGERY  1988   augmentation--Replaced 2013    Family History  Problem Relation Age of Onset  . Cancer Father        unknown origin/cause--he was an alcoholic  . Alcohol abuse Father        estranged from her life  . Other Mother        Dec 67 with complications from Shy-Drager Syndrome  . Alcohol abuse Maternal Grandmother   . Lung cancer Maternal Grandfather        heavy smoker  . Other Paternal Grandmother        she died after father's birth    Medications- reviewed and updated No current outpatient medications on file.   No current facility-administered medications for this visit.    Allergies-reviewed and updated Allergies  Allergen Reactions  . Shrimp [Shellfish Allergy]     Throat swelling- likely anaphylaxis. Didn't have to take benadryl or otherthough and resolved    Social History   Social History Narrative   Married. Dr. Durene Cal sees her husband.       Works as Midwife   4 year college at [employer]      Hobbies: ballroom dancing twice a week   Objective    Objective:  BP 114/68   Pulse 78   Temp 98.3 F (36.8 C) (Temporal)   Resp 18   Ht 5\' 4"  (1.626 m)   Wt 117 lb 12.8 oz (53.4 kg)   LMP 08/20/2008   SpO2 98%   BMI 20.22 kg/m  Gen: NAD, resting comfortably HEENT: Mucous membranes are moist. Oropharynx normal Neck: no thyromegaly CV: RRR no murmurs rubs or gallops Lungs: CTAB no crackles, wheeze, rhonchi Abdomen: soft/nontender/nondistended/normal bowel sounds. No rebound or guarding.  Ext: no edema Skin: warm, dry Neuro: grossly normal, moves all extremities, PERRLA   Assessment and Plan   64 y.o. female presenting for annual physical.  Health Maintenance counseling: 1. Anticipatory guidance: Patient counseled regarding regular dental exams q6 months, eye exams- yearly ,  avoiding smoking and second hand smoke (outside only), limiting alcohol to 1 beverage per day.   2. Risk factor reduction:  Advised patient of need for regular exercise and diet rich and fruits and vegetables to reduce risk of heart attack and stroke. Exercise- 5 days a week stil. Diet-very healthy/clearn diet- would like her to avoid further weight loss.  Wt Readings from Last 3 Encounters:  05/17/20 117 lb 12.8 oz (53.4 kg)  10/05/19 127 lb (57.6 kg)  03/27/19 125 lb 3.2 oz (56.8 kg)  3. Immunizations/screenings/ancillary  studies-with potential occupational exposure she would like to do covid booster first at pharmacy oct 5 and then flu shot 2-4 weeks later.  Shingrix vaccination- prioritize flu and covid- consider shingrix next year.   Immunization History  Administered Date(s) Administered  . Influenza,inj,Quad PF,6+ Mos 06/04/2019  . PFIZER SARS-COV-2 Vaccination 10/26/2019, 11/23/2019  . Td 01/15/2008  . Tdap 10/03/2018   4. Cervical cancer screening- Pap in 2020 with 3-year repeat planned with Dr.  Edward Jolly 5. Breast cancer screening-  breast exam with gynecology and mammogram 01/27/20 6. Colon cancer screening -October 07, 2015 with 10-year follow-up  planned 7. Skin cancer screening-no dermatologist. advised regular sunscreen use. Denies worrisome, changing, or new skin lesions.  8. Birth control/STD check- postmenopausal/monogamous 9. Osteoporosis screening at 39- last June 2020 follows with gynecology for osteopenia -Non smoker-quit in 2000 2:05 pack years-no regular screening required  Status of chronic or acute concerns   #Social update-last year was considering retirement versus returning for international travel only. She is on leave until November but still considering restarting. A lot of stress as customers have been rude.   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 Her current plan is to monitor unless has pain- this was left after removed prior rupture of a different implant that was removed and could not remove it all. Holding off on plastic surgery consult  #Vitamin D deficiency S: Medication:  MV only and not sure amount of vitamin D Last vitamin D Lab Results  Component Value Date   VD25OH 50.2 10/05/2019  A/P: Excellent control earlier this year-patient currently taking only vitamin D-update levels today  #hyperlipidemia- would consider this mild with newer LDL goal under 70 S: Medication:none  Lab Results  Component Value Date   CHOL 176 10/05/2019   HDL 67 10/05/2019   LDLCALC 98 10/05/2019   TRIG 56 10/05/2019   CHOLHDL 2.6 10/05/2019   A/P: 10 year ascvd risk only 3% below threshold to consider medicine.  - has tightened diet since that time so would be interested in changes at one year  # Macrocytosis- mild issue in past- will check b12 and folate with labs today. Minimal alcohol intake No results found for: VITAMINB12  # right 2nd toe - hit this toe hard at beach- still able to walk. We discussed possible fracture- offered x-ray but she is able to walk without issues including 2.5 miles yesterday so opted out  Recommended follow up: Return in about 1 year  (around 05/17/2021) for physical or sooner if needed. Future Appointments  Date Time Provider Department Center  10/05/2020  1:30 PM Patton Salles, MD [facility] None   Lab/Order associations:Non fasting   ICD-10-CM   1. Preventative health care  Z00.00   2. Vitamin D deficiency  E55.9 VITAMIN D 25 Hydroxy (Vit-D Deficiency, Fractures)  3. Macrocytosis  D75.89 CBC With Differential/Platelet    Vitamin B12    Folate RBC    No orders of the defined types were placed in this encounter.   Return precautions advised.  Tana Conch, MD

## 2020-05-17 ENCOUNTER — Encounter: Payer: Self-pay | Admitting: Family Medicine

## 2020-05-17 ENCOUNTER — Other Ambulatory Visit: Payer: Self-pay

## 2020-05-17 ENCOUNTER — Ambulatory Visit (INDEPENDENT_AMBULATORY_CARE_PROVIDER_SITE_OTHER): Payer: BC Managed Care – PPO | Admitting: Family Medicine

## 2020-05-17 VITALS — BP 114/68 | HR 78 | Temp 98.3°F | Resp 18 | Ht 64.0 in | Wt 117.8 lb

## 2020-05-17 DIAGNOSIS — E559 Vitamin D deficiency, unspecified: Secondary | ICD-10-CM | POA: Diagnosis not present

## 2020-05-17 DIAGNOSIS — D7589 Other specified diseases of blood and blood-forming organs: Secondary | ICD-10-CM

## 2020-05-17 DIAGNOSIS — Z0000 Encounter for general adult medical examination without abnormal findings: Secondary | ICD-10-CM | POA: Diagnosis not present

## 2020-05-18 LAB — VITAMIN B12: Vitamin B-12: 561 pg/mL (ref 200–1100)

## 2020-05-18 LAB — CBC WITH DIFFERENTIAL/PLATELET
Absolute Monocytes: 391 cells/uL (ref 200–950)
Basophils Absolute: 34 cells/uL (ref 0–200)
Basophils Relative: 0.4 %
Eosinophils Absolute: 187 cells/uL (ref 15–500)
Eosinophils Relative: 2.2 %
HCT: 38.5 % (ref 35.0–45.0)
Hemoglobin: 12.9 g/dL (ref 11.7–15.5)
Lymphs Abs: 1717 cells/uL (ref 850–3900)
MCH: 33.3 pg — ABNORMAL HIGH (ref 27.0–33.0)
MCHC: 33.5 g/dL (ref 32.0–36.0)
MCV: 99.5 fL (ref 80.0–100.0)
MPV: 11.4 fL (ref 7.5–12.5)
Monocytes Relative: 4.6 %
Neutro Abs: 6171 cells/uL (ref 1500–7800)
Neutrophils Relative %: 72.6 %
Platelets: 290 10*3/uL (ref 140–400)
RBC: 3.87 10*6/uL (ref 3.80–5.10)
RDW: 13 % (ref 11.0–15.0)
Total Lymphocyte: 20.2 %
WBC: 8.5 10*3/uL (ref 3.8–10.8)

## 2020-05-18 LAB — VITAMIN D 25 HYDROXY (VIT D DEFICIENCY, FRACTURES): Vit D, 25-Hydroxy: 44 ng/mL (ref 30–100)

## 2020-05-18 LAB — FOLATE RBC: RBC Folate: 775 ng/mL

## 2020-07-20 DIAGNOSIS — U071 COVID-19: Secondary | ICD-10-CM

## 2020-07-20 HISTORY — DX: COVID-19: U07.1

## 2020-07-28 ENCOUNTER — Telehealth (INDEPENDENT_AMBULATORY_CARE_PROVIDER_SITE_OTHER): Payer: BC Managed Care – PPO | Admitting: Family Medicine

## 2020-07-28 DIAGNOSIS — H9201 Otalgia, right ear: Secondary | ICD-10-CM | POA: Diagnosis not present

## 2020-07-28 DIAGNOSIS — R0982 Postnasal drip: Secondary | ICD-10-CM

## 2020-07-28 DIAGNOSIS — J029 Acute pharyngitis, unspecified: Secondary | ICD-10-CM | POA: Diagnosis not present

## 2020-07-28 DIAGNOSIS — R059 Cough, unspecified: Secondary | ICD-10-CM | POA: Diagnosis not present

## 2020-07-28 NOTE — Patient Instructions (Addendum)
-  Allegra once daily for 3 weeks  -Afrin nasal spray for 3 days per instructions  -Flonase 2 sprays each nostril daily for 3 weeks  -Nasal saline can also be helpful  -consider covid testing, we do advise staying home if feeling sick   I hope you are feeling better soon!  Seek in person care promptly if your symptoms worsen, new concerns arise or you are not improving with treatment.  It was nice to meet you today. I help  out with telemedicine visits on Tuesdays and Thursdays and am available for visits on those days. If you have any concerns or questions following this visit please schedule a follow up visit with your Primary Care doctor or seek care at a local urgent care clinic to avoid delays in care.

## 2020-07-28 NOTE — Progress Notes (Signed)
Virtual Visit via Video Note  I connected with Bridget Mccarthy  on 07/28/20 at 10:00 AM EST by a video enabled telemedicine application and verified that I am speaking with the correct person using two identifiers. She tried to log into the video platform but was kicked out so we completed the visit via audio only.  Location patient: home, [place] Location provider:work or home office Persons participating in the virtual visit: patient, provider  I discussed the limitations of evaluation and management by telemedicine and the availability of in person appointments. The patient expressed understanding and agreed to proceed.   HPI:  Acute telemedicine visit for upper respiratory symptoms: -Onset:over 1 week ago -Symptoms include: scratchy throat intermittently, sore throat, R ear discomfort, cough, pnd -now the throat is resolved, ear is feeling better today -Denies: fevers, body aches, CP, SOB, NVD, malaise -Has tried:decongestants and throat lozenges -Pertinent past medical history:does tend to get allergies this time of the year - has history  -Pertinent medication allergies: Shrimp -COVID-19 vaccine status: fully vaccinated + booster  ROS: See pertinent positives and negatives per HPI.  Past Medical History:  Diagnosis Date  . Chicken pox   . Low vitamin D level 2019  . Osteopenia 06/03/2008  . SYMPTOMATIC MENOPAUSAL/FEMALE CLIMACTERIC STATES 01/15/2008  . Undiagnosed cardiac murmurs 01/15/2008   Was told by prior GI doctor that she had a murmur. hasnt been heard by other providers  . UTI (urinary tract infection)     Past Surgical History:  Procedure Laterality Date  . BREAST SURGERY  1988   augmentation--Replaced 2013    No current outpatient medications on file.  EXAM:  VITALS per patient if applicable:  GENERAL:  no audible sounds of acute distress  LUNGS:no audiblegross SOB, gasping or wheezing  PSYCH/NEURO: pleasant and cooperative,  speech and thought processing grossly  intact  ASSESSMENT AND PLAN:  Discussed the following assessment and plan:  Sore throat  Cough  PND (post-nasal drip)  Discomfort of right ear  -we discussed possible serious and likely etiologies, options for evaluation and workup, limitations of telemedicine visit vs in person visit, treatment, treatment risks and precautions. Pt prefers to treat via telemedicine empirically rather than in person at this moment.  Query eustachian tube dysfunction associated with allergic rhinitis or a viral upper respiratory illness versus other.  Opted for empiric treatment with antihistamine, nasal decongestant for 3 days, INS and nasal saline.  Advised low threshold for follow-up if any fevers, worsening or the ear discomfort worsens or persists.  She is fully vaccinated plus a booster for Covid, but with a new variant present in our country, did advise that she consider Covid testing and stay home while feeling sick. Work/School slipped offered: declined Scheduled follow up with PCP offered: Agrees to follow-up if needed Advised to seek prompt in person care if worsening, new symptoms arise, or if is not improving with treatment. Discussed options for inperson care if PCP office not available. Did let this patient know that I only do telemedicine on Tuesdays and Thursdays for [place]. Advised to schedule follow up visit with PCP or [facility] if any further questions or concerns to avoid delays in care.   I discussed the assessment and treatment plan with the patient.  Spent 23 minutes on this encounter.  The patient was provided an opportunity to ask questions and all were answered. The patient agreed with the plan and demonstrated an understanding of the instructions.     Terressa Koyanagi, DO

## 2020-08-16 ENCOUNTER — Ambulatory Visit: Payer: BC Managed Care – PPO | Admitting: Physician Assistant

## 2020-08-16 ENCOUNTER — Telehealth (INDEPENDENT_AMBULATORY_CARE_PROVIDER_SITE_OTHER): Payer: BC Managed Care – PPO | Admitting: Physician Assistant

## 2020-08-16 ENCOUNTER — Other Ambulatory Visit: Payer: Self-pay

## 2020-08-16 ENCOUNTER — Encounter: Payer: Self-pay | Admitting: Physician Assistant

## 2020-08-16 VITALS — Temp 98.7°F | Ht 64.0 in | Wt 113.0 lb

## 2020-08-16 DIAGNOSIS — U071 COVID-19: Secondary | ICD-10-CM

## 2020-08-16 MED ORDER — AZITHROMYCIN 250 MG PO TABS: ORAL_TABLET | ORAL | 0 refills | Status: DC

## 2020-08-16 NOTE — Progress Notes (Signed)
Virtual Visit via Video   I connected with Bridget Mccarthy on 08/16/20 at  4:00 PM EST by a video enabled telemedicine application and verified that I am speaking with the correct person using two identifiers. Location patient: Home Location provider: [facility], Office Persons participating in the virtual visit: Astraea Gaughran, Jarold Motto PA-C, Corky Mull, LPN   I discussed the limitations of evaluation and management by telemedicine and the availability of in person appointments. The patient expressed understanding and agreed to proceed.  I acted as a Neurosurgeon for [employer], PA-C [employer], LPN   Subjective:   HPI:   Patient is requesting evaluation for possible COVID-19.  Symptom onset: Started 3 weeks ago, saw Dr. Selena Batten virtually. Started a cough 3-4 days ago. Positive home test today. Did have a negative home test on the 23rd of December prior to home get-together.  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   Patient risk factors: Current COVID-19 risk of complications score: 1 Smoking status: Bridget Mccarthy  reports that she quit smoking about 17 years ago. She has a 2.00 pack-year smoking history. She has never used smokeless tobacco. If female, currently pregnant? []   Yes [x]   No  ROS: See pertinent positives and negatives per HPI.  Patient Active Problem List   Diagnosis Date Noted  . Left knee pain 10/10/2018  . Vitamin D deficiency 01/03/2018  . Former smoker 01/03/2018    Social  History   Tobacco Use  . Smoking status: Former Smoker    Packs/day: 0.10    Years: 20.00    Pack years: 2.00    Quit date: 08/21/2003    Years since quitting: 17.0  . Smokeless tobacco: Never Used  Substance Use Topics  . Alcohol use: Yes    Alcohol/week: 5.0 - 7.0 standard drinks    Types: 5 - 7 Standard drinks or equivalent per week    Current Outpatient Medications:  .  azithromycin (ZITHROMAX) 250 MG tablet, Take two tablets on day 1, then on daily x 4 days, Disp: 6 tablet, Rfl: 0 .  benzonatate (TESSALON) 100 MG capsule, Take by mouth 3 (three) times daily as needed for cough., Disp: , Rfl:  .  fexofenadine (ALLEGRA) 180 MG tablet, Take 180 mg by mouth daily., Disp: , Rfl:  .  fluticasone (FLONASE) 50 MCG/ACT nasal spray, Place 2 sprays into both nostrils daily., Disp: , Rfl:   Allergies  Allergen Reactions  . Shrimp [Shellfish Allergy]     Throat swelling- likely anaphylaxis. Didn't have to take benadryl or otherthough and resolved    Objective:   VITALS: Per patient if applicable, see vitals. GENERAL: Alert, appears well and in no acute distress. HEENT: Atraumatic, conjunctiva clear, no obvious abnormalities on inspection of external nose and ears. NECK: Normal movements of the head and neck. CARDIOPULMONARY: No increased WOB. Speaking in clear sentences. I:E ratio WNL.  MS: Moves all visible extremities without noticeable abnormality. PSYCH: Pleasant and cooperative, well-groomed. Speech normal rate and rhythm. Affect is appropriate. Insight and judgement are appropriate. Attention is focused, linear, and appropriate.  NEURO:  CN grossly intact. Oriented as arrived to appointment on time with no prompting. Moves both UE equally.  SKIN: No obvious lesions, wounds, erythema, or cyanosis noted on face or hands.  Assessment and Plan:   Shelita was seen today for covid positive.  Diagnoses and all orders for this visit:  COVID-19  Other orders -     azithromycin  (ZITHROMAX) 250 MG tablet; Take two tablets on day 1, then on daily x 4 days    No red flags on discussion, patient is not in any obvious distress during our visit. Discussed progression of most viral illness, and recommended supportive care at this point in time.   I did send in a pocket rx for azithromycin if her sinusitis symptoms do not improve in the next 5-7 days as she has had symptoms since the beginning of the month.  She declines COVID antibody infusion at this time.  Discussed over the counter supportive care options, with recommendations to push fluids and rest. Reviewed return precautions including new/worsening fever, SOB, new/worsening cough or other concerns.  Recommended need to self-quarantine and practice social distancing until symptoms resolve. I recommend that patient follow-up if symptoms worsen or persist despite treatment x 7-10 days, sooner if needed.  I discussed the assessment and treatment plan with the patient. The patient was provided an opportunity to ask questions and all were answered. The patient agreed with the plan and demonstrated an understanding of the instructions.   The patient was advised to call back or seek an in-person evaluation if the symptoms worsen or if the condition fails to improve as anticipated.   CMA or LPN served as scribe during this visit. History, Physical, and Plan performed by medical provider. The above documentation has been reviewed and is accurate and complete.   [place], [place] 08/16/2020

## 2020-10-05 ENCOUNTER — Ambulatory Visit (INDEPENDENT_AMBULATORY_CARE_PROVIDER_SITE_OTHER): Payer: BC Managed Care – PPO | Admitting: Obstetrics and Gynecology

## 2020-10-05 ENCOUNTER — Other Ambulatory Visit: Payer: Self-pay

## 2020-10-05 ENCOUNTER — Encounter: Payer: Self-pay | Admitting: Obstetrics and Gynecology

## 2020-10-05 VITALS — BP 110/60 | HR 84 | Ht 64.25 in | Wt 116.0 lb

## 2020-10-05 DIAGNOSIS — Z01419 Encounter for gynecological examination (general) (routine) without abnormal findings: Secondary | ICD-10-CM

## 2020-10-05 DIAGNOSIS — Z780 Asymptomatic menopausal state: Secondary | ICD-10-CM

## 2020-10-05 NOTE — Progress Notes (Signed)
65 y.o. G65P0000 Married Caucasian female here for annual exam.    Soreness in her right axillary area.  No mass.  No trauma.  Lifting at work.   Back at work and Audiological scientist.   Received her Covid booster and Covid vaccine.   Had Covid at the end of December.  PCP: Aldine Contes. Hunter, MD    Patient's last menstrual period was 08/20/2008.           Sexually active: No.  The current method of family planning is post menopausal status.    Exercising: Yes.    5 times weekly Smoker:  no  Health Maintenance: Pap:  10/03/18 Neg:Neg HR HPV  09/01/15 Neg:Neg HR HPV History of abnormal Pap:  Yes, hx of cryotherapy years ago MMG:  01/27/20 BIRADS 1 negative/density c Colonoscopy:  10/07/15 BMD:   02/06/19  Result  Osteopenia TDaP:  10/03/18 Gardasil:   n/a HIV and Hep C: 09/01/15 Neg Screening Labs: PCP   reports that she quit smoking about 17 years ago. She has a 2.00 pack-year smoking history. She has never used smokeless tobacco. She reports current alcohol use of about 5.0 - 7.0 standard drinks of alcohol per week. She reports that she does not use drugs.  Past Medical History:  Diagnosis Date  . Chicken pox   . COVID 07/2020  . Low vitamin D level 2019  . Osteopenia 06/03/2008  . SYMPTOMATIC MENOPAUSAL/FEMALE CLIMACTERIC STATES 01/15/2008  . Undiagnosed cardiac murmurs 01/15/2008   Was told by prior GI doctor that she had a murmur. hasnt been heard by other providers  . UTI (urinary tract infection)     Past Surgical History:  Procedure Laterality Date  . BREAST SURGERY  1988   augmentation--Replaced 2013    Current Outpatient Medications  Medication Sig Dispense Refill  . Multiple Vitamin (MULTIVITAMIN) tablet Take 1 tablet by mouth daily.    Marland Kitchen VITAMIN D PO Take by mouth.     No current facility-administered medications for this visit.    Family History  Problem Relation Age of Onset  . Cancer Father        unknown origin/cause--he was an alcoholic  .  Alcohol abuse Father        estranged from her life  . Other Mother        Dec 67 with complications from Shy-Drager Syndrome  . Lung cancer Maternal Grandfather        heavy smoker  . Other Paternal Grandmother        she died after father's birth    Review of Systems  Constitutional: Negative.   HENT: Negative.   Eyes: Negative.   Respiratory: Negative.   Cardiovascular: Negative.   Gastrointestinal: Negative.   Endocrine: Negative.   Genitourinary: Negative.   Musculoskeletal: Negative.   Skin: Negative.   Allergic/Immunologic: Negative.   Neurological: Negative.   Hematological: Negative.   Psychiatric/Behavioral: Negative.     Exam:   BP 110/60 (BP Location: Right Arm, Patient Position: Sitting, Cuff Size: Normal)   Pulse 84   Ht 5' 4.25" (1.632 m)   Wt 116 lb (52.6 kg)   LMP 08/20/2008   BMI 19.76 kg/m     General appearance: alert, cooperative and appears stated age Head: normocephalic, without obvious abnormality, atraumatic Neck: no adenopathy, supple, symmetrical, trachea midline and thyroid normal to inspection and palpation Lungs: clear to auscultation bilaterally Breasts: consistent with implants, no masses or tenderness, No nipple retraction or dimpling, No nipple discharge or  bleeding, No axillary adenopathy.  Small 2 - 3 mm sebaceous cyst of the right axilla.   Heart: regular rate and rhythm Abdomen: soft, non-tender; no masses, no organomegaly Extremities: extremities normal, atraumatic, no cyanosis or edema Skin: skin color, texture, turgor normal. No rashes or lesions Lymph nodes: cervical, supraclavicular, and axillary nodes normal. Neurologic: grossly normal  Pelvic: External genitalia:  no lesions              No abnormal inguinal nodes palpated.              Urethra:  normal appearing urethra with no masses, tenderness or lesions              Bartholins and Skenes: normal                 Vagina: atrophy noted.  No lesions.                Cervix: no lesions              Pap taken: No. Bimanual Exam:  Uterus:  normal size, contour, position, consistency, mobility, non-tender              Adnexa: no mass, fullness, tenderness              Rectal exam: Yes.  .  Confirms.              Anus:  normal sphincter tone, no lesions  Chaperone was present for exam.  Assessment:   Well woman visit with normal exam. 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  Remote hx abnormal pap. Atrophy of vagina.   Osteopenia. Vit D deficiency.  Plan: Mammogram screening discussed. Self breast awareness reviewed. Pap and HR HPV as above. Guidelines for Calcium, Vitamin D, regular exercise program including cardiovascular and weight bearing exercise. BMD ordered.  We discussed water based lubricants and cooking oils for vaginal hydration.  Follow up annually and prn.

## 2020-10-05 NOTE — Patient Instructions (Signed)

## 2020-10-06 ENCOUNTER — Other Ambulatory Visit: Payer: Self-pay | Admitting: Obstetrics and Gynecology

## 2020-10-06 DIAGNOSIS — Z780 Asymptomatic menopausal state: Secondary | ICD-10-CM

## 2020-10-06 DIAGNOSIS — Z1231 Encounter for screening mammogram for malignant neoplasm of breast: Secondary | ICD-10-CM

## 2020-12-14 ENCOUNTER — Other Ambulatory Visit: Payer: Self-pay

## 2020-12-14 ENCOUNTER — Telehealth: Payer: Self-pay

## 2020-12-14 ENCOUNTER — Encounter: Payer: Self-pay | Admitting: Family Medicine

## 2020-12-14 ENCOUNTER — Ambulatory Visit: Payer: BC Managed Care – PPO | Admitting: Family Medicine

## 2020-12-14 VITALS — BP 116/70 | HR 57 | Temp 96.1°F | Ht 64.25 in | Wt 116.6 lb

## 2020-12-14 DIAGNOSIS — A084 Viral intestinal infection, unspecified: Secondary | ICD-10-CM | POA: Diagnosis not present

## 2020-12-14 MED ORDER — ONDANSETRON HCL 4 MG PO TABS: 4.0000 mg | ORAL_TABLET | Freq: Three times a day (TID) | ORAL | 0 refills | Status: DC | PRN

## 2020-12-14 NOTE — Patient Instructions (Signed)
1) viral GI bug in community with nausea and vomiting.  2) sent in zofran as needed for nausea and vomting 3) really keep trying to hydrate! Water, ginger ale, Gatorade, and then the [employer] if you feel like you can start to eat.    -please let us know if pain doesn't get better as we will need to do labs/imaging and if not starting to feel better.    Viral Gastroenteritis, Adult  Viral gastroenteritis is also known as the stomach flu. This condition may affect your stomach, your small intestine, and your large intestine. It can cause sudden watery poop (diarrhea), fever, and throwing up (vomiting). This condition is caused by certain germs (viruses). These germs can be passed from person to person very easily (are contagious). Having watery poop and throwing up can make you feel weak and cause you to not have enough water in your body (get dehydrated). This can make you tired and thirsty, make you have a dry mouth, and make it so you pee (urinate) less often. It is important to replace the fluids that you lose from having watery poop and throwing up. What are the causes?  You can get sick by catching viruses from other people.  You can also get sick by: ? Eating food, drinking water, or touching a surface that has the viruses on it (is contaminated). ? Sharing utensils or other personal items with a person who is sick. What increases the risk?  Having a weak body defense system (immune system).  Living with one or more children who are younger than 5 years old.  Living in a nursing home.  Going on cruise ships. What are the signs or symptoms? Symptoms of this condition start suddenly. Symptoms may last for a few days or for as long as a week.  Common symptoms include: ? Watery poop. ? Throwing up.  Other symptoms include: ? Fever. ? Headache. ? Feeling tired (fatigue). ? Pain in the belly (abdomen). ? Chills. ? Feeling weak. ? Feeling sick to your stomach  (nauseous). ? Muscle aches. ? Not feeling hungry. How is this treated?  This condition typically goes away on its own.  The focus of treatment is to replace the fluids that you lose. This condition may be treated with: ? An ORS (oral rehydration solution). This is a drink that is sold at pharmacies and stores. ? Medicines to help with your symptoms. ? Probiotic supplements to reduce symptoms of diarrhea. ? Fluids given through an IV tube, if needed.  Older adults and people with other diseases or a weak body defense system are at higher risk for not having enough water in the body. Follow these instructions at home: Eating and drinking  Take an ORS as told by your doctor.  Drink clear fluids in small amounts as you are able. Clear fluids include: ? Water. ? Ice chips. ? Fruit juice with water added to it (diluted). ? Low-calorie sports drinks.  Drink enough fluid to keep your pee (urine) pale yellow.  Eat small amounts of healthy foods every 3-4 hours as you are able. This may include whole grains, fruits, vegetables, lean meats, and yogurt.  Avoid fluids that have a lot of sugar or caffeine in them, such as energy drinks, sports drinks, and soda.  Avoid spicy or fatty foods.  Avoid alcohol.   General instructions  Wash your hands often. This is very important after you have watery poop or you throw up. If you cannot use  soap and water, use hand sanitizer.  Make sure that all people in your home wash their hands well and often.  Take over-the-counter and prescription medicines only as told by your doctor.  Rest at home while you get better.  Watch your condition for any changes.  Take a warm bath to help with any burning or pain from having watery poop.  Keep all follow-up visits as told by your doctor. This is important.   Contact a doctor if:  You cannot keep fluids down.  Your symptoms get worse.  You have new symptoms.  You feel light-headed.  You feel  dizzy.  You have muscle cramps. Get help right away if:  You have chest pain.  You feel very weak.  You pass out (faint).  You see blood in your throw-up.  Your throw-up looks like coffee grounds.  You have bloody or black poop (stools) or poop that looks like tar.  You have a very bad headache, or a stiff neck, or both.  You have a rash.  You have very bad pain, cramping, or bloating in your belly.  You have trouble breathing.  You are breathing very quickly.  You have a fast heartbeat.  Your skin feels cold and clammy.  You feel mixed up (confused).  You have pain when you pee.  You have signs of not having enough water in the body, such as: ? Dark pee, hardly any pee, or no pee. ? Cracked lips. ? Dry mouth. ? Sunken eyes. ? Feeling very sleepy. ? Feeling weak. Summary  Viral gastroenteritis is also known as the stomach flu.  This condition can cause sudden watery poop (diarrhea), fever, and throwing up (vomiting).  These germs can be passed from person to person very easily.  Take an ORS as told by your doctor. This is a drink that is sold at pharmacies and stores.  Drink fluids in small amounts many times each day as you are able. This information is not intended to replace advice given to you by your health care provider. Make sure you discuss any questions you have with your health care provider. Document Revised: 06/11/2018 Document Reviewed: 06/11/2018 Elsevier Patient Education  2021 [employer].

## 2020-12-14 NOTE — Telephone Encounter (Signed)
Pt being seen today

## 2020-12-14 NOTE — Progress Notes (Signed)
Patient: Bridget Mccarthy MRN: [id number] DOB: 1955-11-25 PCP: Shelva Majestic, MD     Subjective:  Chief Complaint  Patient presents with  . Nausea  . Emesis    2 episodes, started this morning. She has taken Tums. None since then.  . Abdominal Pain    Describes the feeling as having a knot in her abd.    HPI: The patient is a 65 y.o. female who presents today for nausea and vomiting. She states this AM (around 3am) she woke up with a stomach ache, described as burning.  She couldn't get comfortable. She started to feel nauseated around 5am. She started vomiting around 7am and then 2 hours later she had another episode of vomiting and has been vomiting every hour for about 5 episodes. She has had one episode of loose stool, but she states it wasn't really diarrhea. She feels like her stomach is in a huge knot. She is having a hard time keeping anything down, even liquids. Trying to sip on Gatorade. She denies eating out, fried rice or any sick contacts. 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 No fever, but she does have chills. She has urinated a few times today. She did take some tums that did not do anything.   Review of Systems  Constitutional: Positive for chills and fatigue. Negative for diaphoresis and fever.  Respiratory: Negative for cough and shortness of breath.   Cardiovascular: Negative for chest pain.  Gastrointestinal: Positive for abdominal pain, nausea and vomiting. Negative for blood in stool and diarrhea.  Neurological: Negative for dizziness and headaches.    Allergies Patient is allergic to shrimp [shellfish allergy].  Past Medical History Patient  has a past medical history of Chicken pox, COVID (07/2020), Low vitamin D level (2019), Osteopenia (06/03/2008), SYMPTOMATIC MENOPAUSAL/FEMALE CLIMACTERIC STATES (01/15/2008), Undiagnosed cardiac murmurs (01/15/2008), and UTI (urinary tract infection).  Surgical History Patient  has a past surgical  history that includes Breast surgery (1988).  Family History Pateint's family history includes Alcohol abuse in her father; Cancer in her father; Lung cancer in her maternal grandfather; Other in her mother and paternal grandmother.  Social History Patient  reports that she quit smoking about 17 years ago. She has a 2.00 pack-year smoking history. She has never used smokeless tobacco. She reports current alcohol use of about 5.0 - 7.0 standard drinks of alcohol per week. She reports that she does not use drugs.    Objective: Vitals:   12/14/20 1110  BP: 116/70  Pulse: (!) 57  Temp: (!) 96.1 F (35.6 C)  TempSrc: Temporal  SpO2: 99%  Weight: 116 lb 9.6 oz (52.9 kg)  Height: 5' 4.25" (1.632 m)    Body mass index is 19.86 kg/m.  Physical Exam Vitals reviewed.  Constitutional:      General: She is not in acute distress.    Appearance: She is well-developed and normal weight. She is not ill-appearing.  HENT:     Head: Normocephalic and atraumatic.     Mouth/Throat:     Mouth: Mucous membranes are moist.  Eyes:     Extraocular Movements: Extraocular movements intact.     Pupils: Pupils are equal, round, and reactive to light.  Cardiovascular:     Rate and Rhythm: Normal rate and regular rhythm.     Heart sounds: Normal heart sounds.  Pulmonary:     Effort: Pulmonary effort is normal.     Breath sounds: Normal breath sounds.  Abdominal:  General: Abdomen is flat. Bowel sounds are decreased. There is no distension.     Palpations: Abdomen is soft.     Tenderness: There is no abdominal tenderness.  Skin:    General: Skin is warm.     Comments: Skin tenting on hands. Can not check capillary refill acrylic nails.   Neurological:     General: No focal deficit present.     Mental Status: She is alert and oriented to person, place, and time.        Assessment/plan: 1. Viral gastroenteritis -high transmission in community -zofran prn for nausea/vomiting -discussed  fluids/BRAT diet when feels like she can eat.  -precautions given for dehydration, worsening pain.     Return if symptoms worsen or fail to improve.    Orland Mustard, MD [facility]   12/14/2020

## 2020-12-14 NOTE — Telephone Encounter (Signed)
Nurse Assessment Nurse: Loistine Simas RN, Lannette Donath Date/Time (Eastern Time): 12/14/2020 8:12:24 AM Confirm and document reason for call. If symptomatic, describe symptoms. ---Caller woke up with 'heartburn in stomach' and felt uncomfortable in her stomach. She has been nausea since and has the chills. Vomited twice this morning. Denies fever. Does the patient have any new or worsening symptoms? ---Yes Will a triage be completed? ---Yes Related visit to physician within the last 2 weeks? ---No Does the PT have any chronic conditions? (i.e. diabetes, asthma, this includes High risk factors for pregnancy, etc.) ---No Is this a behavioral health or substance abuse call? ---No Guidelines Guideline Title Affirmed Question Affirmed Notes Nurse Date/Time (Eastern Time) Vomiting [1] MODERATE vomiting (e.g., 3 - 5 times/day) AND [2] age > 69 years [place]on, RN, Lannette Donath 12/14/2020 8:14:43 AM Disp. Time Lamount Cohen Time) Disposition Final User PLEASE NOTE: All timestamps contained within this report are represented as [place] Standard Time. CONFIDENTIALTY NOTICE: This fax transmission is intended only for the addressee. It contains information that is legally privileged, confidential or otherwise protected from use or disclosure. If you are not the intended recipient, you are strictly prohibited from reviewing, disclosing, copying using or disseminating any of this information or taking any action in reliance on or regarding this information. If you have received this fax in error, please notify us immediately by telephone so that we can arrange for its return to [place]. Phone: [phone], Toll-Free: [phone], Fax: [phone] Page: 2 of 2 Call Id: [id number] 12/14/2020 8:23:08 AM Go to ED Now (or PCP triage) Yes Loistine Simas, RN, Roe Rutherford Disagree/Comply Disagree Caller Understands Yes PreDisposition Did not know what to do Care Advice Given Per Guideline CARE ADVICE per Vomiting (Adult) guideline. GO TO ED  NOW (OR PCP TRIAGE): Comments User: Maryan Puls, RN Date/Time Lamount Cohen Time): 12/14/2020 8:23:43 AM Caller did not want an appt within the hour, states she doesn't think she can leave the house. States she wants an appt late morning/early afternoon. Transferred caller to scheduling. Referrals REFERRED TO PCP OFFIC

## 2021-03-13 ENCOUNTER — Ambulatory Visit
Admission: RE | Admit: 2021-03-13 | Discharge: 2021-03-13 | Disposition: A | Discharge status: Home / Self Care | Payer: BC Managed Care – PPO | Source: Ambulatory Visit | Attending: Obstetrics and Gynecology | Admitting: Obstetrics and Gynecology

## 2021-03-13 ENCOUNTER — Other Ambulatory Visit: Payer: Self-pay

## 2021-03-13 DIAGNOSIS — Z780 Asymptomatic menopausal state: Secondary | ICD-10-CM

## 2021-03-13 DIAGNOSIS — Z1231 Encounter for screening mammogram for malignant neoplasm of breast: Secondary | ICD-10-CM

## 2021-05-18 ENCOUNTER — Ambulatory Visit (INDEPENDENT_AMBULATORY_CARE_PROVIDER_SITE_OTHER): Payer: BC Managed Care – PPO | Admitting: Family Medicine

## 2021-05-18 ENCOUNTER — Other Ambulatory Visit: Payer: Self-pay

## 2021-05-18 ENCOUNTER — Encounter: Payer: Self-pay | Admitting: Family Medicine

## 2021-05-18 VITALS — BP 109/70 | HR 72 | Temp 98.1°F | Ht 64.0 in | Wt 120.2 lb

## 2021-05-18 DIAGNOSIS — E785 Hyperlipidemia, unspecified: Secondary | ICD-10-CM | POA: Diagnosis not present

## 2021-05-18 DIAGNOSIS — Z1283 Encounter for screening for malignant neoplasm of skin: Secondary | ICD-10-CM

## 2021-05-18 DIAGNOSIS — Z0000 Encounter for general adult medical examination without abnormal findings: Secondary | ICD-10-CM | POA: Diagnosis not present

## 2021-05-18 DIAGNOSIS — E559 Vitamin D deficiency, unspecified: Secondary | ICD-10-CM | POA: Diagnosis not present

## 2021-05-18 DIAGNOSIS — M25562 Pain in left knee: Secondary | ICD-10-CM | POA: Diagnosis not present

## 2021-05-18 DIAGNOSIS — M25552 Pain in left hip: Secondary | ICD-10-CM

## 2021-05-18 DIAGNOSIS — G8929 Other chronic pain: Secondary | ICD-10-CM

## 2021-05-18 NOTE — Patient Instructions (Addendum)
Health Maintenance Due  Topic Date Due   COVID-19 Vaccine (3 - Booster for [employer] series)  - Please send me the date of your last COVID shot - suspect this was the new Omicron booster.  04/24/2020   Please stop by lab before you go If you have mychart- we will send your results within 3 business days of [place] receiving them.  If you do not have mychart- we will call you about results within 5 business days of [place] receiving them.  *please also note that you will see labs on mychart as soon as they post. I will later go in and write notes on them- will say "notes from Dr. Durene Cal"  We will call you within two weeks about your referral to Dr. Berline Chough. If you do not hear within 2 weeks, give [place] a call.   We will call you within two weeks about your referral to Dr. Emily Filbert- Dermatology. If you do not hear within 2 weeks, give [place] a call.   Team please give a handout for Calcium in food - goal is to get at least 1200 mg of Calcium per day.  Recommended follow up: Return in about 1 year (around 05/18/2022) for a physical or sooner if needed.

## 2021-05-18 NOTE — Progress Notes (Signed)
Phone [phone]   Subjective:  Patient presents today for their annual physical. Chief complaint-noted.   See problem oriented charting- ROS- full  review of systems was completed and negative except for: joint pain and back pain  The following were reviewed and entered/updated in epic: Past Medical History:  Diagnosis Date   Chicken pox    COVID 07/2020   Low vitamin D level 2019   Osteopenia 06/03/2008   SYMPTOMATIC MENOPAUSAL/FEMALE CLIMACTERIC STATES 01/15/2008   Undiagnosed cardiac murmurs 01/15/2008   Was told by prior GI doctor that she had a murmur. hasnt been heard by other providers   UTI (urinary tract infection)    Patient Active Problem List   Diagnosis Date Noted   Vitamin D deficiency 01/03/2018    Priority: 3.   Former smoker 01/03/2018    Priority: 3.   Left knee pain 10/10/2018   Past Surgical History:  Procedure Laterality Date   BREAST SURGERY  1988   augmentation--Replaced 2013    Family History  Problem Relation Age of Onset   Cancer Father        unknown origin/cause--he was an alcoholic   Alcohol abuse Father        estranged from her life   Other Mother        Dec 67 with complications from Shy-Drager Syndrome   Lung cancer Maternal Grandfather        heavy smoker   Other Paternal Grandmother        she died after father's birth    Medications- reviewed and updated Current Outpatient Medications  Medication Sig Dispense Refill   Multiple Vitamin (MULTIVITAMIN) tablet Take 1 tablet by mouth daily.     No current facility-administered medications for this visit.    Allergies-reviewed and updated Allergies  Allergen Reactions   Shrimp [Shellfish Allergy]     Throat swelling- likely anaphylaxis. Didn't have to take benadryl or otherthough and resolved    Social History   Social History Narrative   Married. Dr. Durene Cal sees her husband.       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 Oropharynx normal Neck: no thyromegaly CV: RRR no murmurs rubs or gallops Lungs: CTAB no crackles, wheeze, rhonchi Abdomen: soft/nontender/nondistended/normal bowel sounds. No rebound or guarding.  Ext: no edema Skin: warm, dry Neuro: grossly normal, moves all extremities, PERRLA   Assessment and Plan   65 y.o. female presenting for annual physical.  Health Maintenance counseling: 1. Anticipatory guidance: Patient counseled regarding regular dental exams -q6 months, eye exams -yearly,  avoiding smoking and second hand smoke, limiting alcohol to 1 beverage per day- far less , no illicit drugs .   2. Risk factor reduction:  Advised patient of need for regular exercise and diet rich and fruits and vegetables to reduce risk of heart attack and stroke. Exercise- 5 days per week- keeping this up. Diet-very healthy and clean diet - wanted her to avoid further weight loss last year and thankfully she is actually gained 4 pounds from last measurement- up 3 lbs  from last cpe as well. Wt Readings from Last 3 Encounters:  05/18/21 120 lb 3.2 oz (54.5 kg)  12/14/20 116 lb 9.6 oz (52.9 kg)  10/05/20 116 lb (52.6 kg)  3. Immunizations/screenings/ancillary studies- discussed Omicron booster-had one in last month so likely omicron booster-  otherwise up-to-date. Had covid dec 2021 Immunization History  Administered Date(s) Administered   Influenza,inj,Quad PF,6+ Mos 06/04/2019   Influenza-Unspecified 05/12/2021   PFIZER(Purple Top)SARS-COV-2 Vaccination 10/26/2019, 11/23/2019   Td 01/15/2008   Tdap 10/03/2018  4. Cervical cancer screening- PAP in 10/03/2018 with a 3-year repeat planned with Dr. Edward Jolly 5. Breast  cancer screening-  breast exam through GYN and mammogram on 03/13/2021 with 1-year repeat planned. 6. Colon cancer screening - last on 10/07/2015 with a 10-year repeat planned 7. Skin cancer screening- no dermatologist recently- agrees to referral to Dr. Emily Filbert. advised regular sunscreen use.  8. Birth control/STD check- postmenopausal/monogamous 9. Osteoporosis screening at 53- last on 03/13/2021 with a 2-3-year repeat planned due to osteopenia. Doesn't like taking pills 10. Smoking associated screening - Non smoker - quit in 2000 - no regular screening required  Status of chronic or acute concerns   #social update- son getting married- doing a lot with this. In [place] so adds to the work plus still work  #left hip and knee pain-  feels like connected to back issues. Ongoing issues- would like to see Dr. Berline Chough- referral placed today  #Vitamin D deficiency S: Medication: Multivitamin only - amount of Vitamin D unknown- not consistnetly taking this year Last vitamin D Lab Results  Component Value Date   VD25OH 44 05/17/2020  A/P: Vitamin D looks excellent last year-update again today- may be worse off with no regular MV lately  #hyperlipidemia S: Medication:None.  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 Her current plan was to monitor unless has pain- this was left after removed prior rupture of a different implant that was removed and could not remove it all - no significant pain recently- will monitor  # Macrocytosis- mild issue in past-was  normal last year. Minimal alcohol intake Lab Results  Component Value Date   WBC 8.5 05/17/2020   HGB 12.9 05/17/2020   HCT 38.5 05/17/2020   MCV 99.5 05/17/2020   PLT 290 05/17/2020   Recommended follow up: No follow-ups on file. Future Appointments  Date Time Provider Department Center  10/10/2021  1:30 PM Patton Salles, MD [facility] None   Lab/Order associations: Not fasting   ICD-10-CM   1. Preventative health care  Z00.00 VITAMIN D 25 Hydroxy (Vit-D Deficiency, Fractures)    CBC with Differential/Platelet    Comprehensive metabolic panel    Lipid panel    2. Vitamin D deficiency  E55.9 VITAMIN D 25 Hydroxy (Vit-D Deficiency, Fractures)    3. Hyperlipidemia, unspecified hyperlipidemia type  E78.5 CBC with Differential/Platelet    Comprehensive metabolic panel    Lipid panel    4. Chronic pain of left knee  M25.562 Ambulatory referral to Sports Medicine  G89.29     5. Left hip pain  M25.552 Ambulatory referral to Sports Medicine    6. Screening exam for skin cancer  Z12.83 Ambulatory referral to Dermatology      No orders of the defined types were placed in this encounter.  I,Harris Phan,acting as a Neurosurgeon for Tana Conch, MD.,have documented all relevant documentation on the behalf of Tana Conch, MD,as directed by  Tana Conch, MD while in the presence of Tana Conch, MD.   I, Tana Conch, MD, have reviewed all documentation for this visit. The documentation on 05/18/21 for the exam, diagnosis, procedures, and orders are all accurate and complete.   Return precautions advised.  Tana Conch, MD

## 2021-05-19 LAB — LIPID PANEL
Cholesterol: 161 mg/dL (ref 0–200)
HDL: 65.7 mg/dL (ref >39.00)
LDL Cholesterol: 83 mg/dL (ref 0–99)
NonHDL: 95.07
Total CHOL/HDL Ratio: 2
Triglycerides: 58 mg/dL (ref 0.0–149.0)
VLDL: 11.6 mg/dL (ref 0.0–40.0)

## 2021-05-19 LAB — COMPREHENSIVE METABOLIC PANEL
ALT: 11 U/L (ref 0–35)
AST: 17 U/L (ref 0–37)
Albumin: 4.4 g/dL (ref 3.5–5.2)
Alkaline Phosphatase: 61 U/L (ref 39–117)
BUN: 16 mg/dL (ref 6–23)
CO2: 31 mEq/L (ref 19–32)
Calcium: 9 mg/dL (ref 8.4–10.5)
Chloride: 100 mEq/L (ref 96–112)
Creatinine, Ser: 0.71 mg/dL (ref 0.40–1.20)
GFR: 89.61 mL/min (ref >60.00)
Glucose, Bld: 93 mg/dL (ref 70–99)
Potassium: 3.7 mEq/L (ref 3.5–5.1)
Sodium: 137 mEq/L (ref 135–145)
Total Bilirubin: 0.4 mg/dL (ref 0.2–1.2)
Total Protein: 7.3 g/dL (ref 6.0–8.3)

## 2021-05-19 LAB — CBC WITH DIFFERENTIAL/PLATELET
Basophils Absolute: 0 10*3/uL (ref 0.0–0.1)
Basophils Relative: 0.7 % (ref 0.0–3.0)
Eosinophils Absolute: 0.1 10*3/uL (ref 0.0–0.7)
Eosinophils Relative: 2.6 % (ref 0.0–5.0)
HCT: 37.1 % (ref 36.0–46.0)
Hemoglobin: 12.3 g/dL (ref 12.0–15.0)
Lymphocytes Relative: 32.3 % (ref 12.0–46.0)
Lymphs Abs: 1.8 10*3/uL (ref 0.7–4.0)
MCHC: 33.1 g/dL (ref 30.0–36.0)
MCV: 99.5 fl (ref 78.0–100.0)
Monocytes Absolute: 0.4 10*3/uL (ref 0.1–1.0)
Monocytes Relative: 7.5 % (ref 3.0–12.0)
Neutro Abs: 3.2 10*3/uL (ref 1.4–7.7)
Neutrophils Relative %: 56.9 % (ref 43.0–77.0)
Platelets: 291 10*3/uL (ref 150.0–400.0)
RBC: 3.72 Mil/uL — ABNORMAL LOW (ref 3.87–5.11)
RDW: 13.1 % (ref 11.5–15.5)
WBC: 5.7 10*3/uL (ref 4.0–10.5)

## 2021-05-19 LAB — VITAMIN D 25 HYDROXY (VIT D DEFICIENCY, FRACTURES): VITD: 37.24 ng/mL (ref 30.00–100.00)

## 2021-06-09 ENCOUNTER — Ambulatory Visit
Admission: RE | Admit: 2021-06-09 | Discharge: 2021-06-09 | Disposition: A | Discharge status: Home / Self Care | Payer: BC Managed Care – PPO | Source: Ambulatory Visit | Attending: Sports Medicine | Admitting: Sports Medicine

## 2021-06-09 ENCOUNTER — Other Ambulatory Visit: Payer: Self-pay

## 2021-06-09 ENCOUNTER — Other Ambulatory Visit: Payer: Self-pay | Admitting: Sports Medicine

## 2021-06-09 DIAGNOSIS — M25552 Pain in left hip: Secondary | ICD-10-CM

## 2021-09-04 ENCOUNTER — Ambulatory Visit (INDEPENDENT_AMBULATORY_CARE_PROVIDER_SITE_OTHER): Payer: BC Managed Care – PPO | Admitting: Orthopaedic Surgery

## 2021-09-04 ENCOUNTER — Encounter: Payer: Self-pay | Admitting: Orthopaedic Surgery

## 2021-09-04 VITALS — Ht 64.0 in | Wt 120.0 lb

## 2021-09-04 DIAGNOSIS — M1612 Unilateral primary osteoarthritis, left hip: Secondary | ICD-10-CM | POA: Insufficient documentation

## 2021-09-04 DIAGNOSIS — M25552 Pain in left hip: Secondary | ICD-10-CM

## 2021-09-04 NOTE — Progress Notes (Signed)
Office Visit Note   Patient: Bridget Mccarthy           Date of Birth: 1956-02-03           MRN: [id number] Visit Date: 09/04/2021              Requested by: Shelva Majestic, MD [address] [place],  [place] [address] PCP: Shelva Majestic, MD   Assessment & Plan: Visit Diagnoses:  1. Pain in left hip   2. Unilateral primary osteoarthritis, left hip     Plan: I spoke to the patient in length in detail about her left hip.  I showed her hip replacement model and went over her x-rays with her.  I agree with proceeding with hip replacement surgery in the near future.  I discussed the risks and benefits of the surgery and what to expect from a intraoperative and postoperative standpoint.  I gave her handout about hip replacement surgery as well.  She would likely need to be out of work a minimum of 6 weeks from up to 3 months given the nature of the work that she performs.  All questions and concerns were answered and addressed.  We will work on getting this scheduled hopefully soon.  She understands that we are usually booked out 4 to 5 weeks.  Follow-Up Instructions: Return for 2 weeks post-op.   Orders:  No orders of the defined types were placed in this encounter.  No orders of the defined types were placed in this encounter.     Procedures: No procedures performed   Clinical Data: No additional findings.   Subjective: No chief complaint on file. The patient comes in today as referral from Dr. Berline Chough to evaluate and treat well-documented end-stage arthritis of the left hip.  She is 66 years old and very active and only takes Celebrex for inflammation.  She has been dealing with left hip pain for over 3 years now.  She had a steroid injection in the left hip joint under radiographic guidance this past November.  It is already worn off.  Her pain is mainly in the groin but she also reports significant left hip stiffness and decreased motion as well.  She has had  several steroid injections now with that left hip.  At this point her left hip pain is daily and it is detrimentally affecting her mobility, her quality of life and actives daily living and she is interested in hip replacement surgery.  Her BMI is only 20.6.  She is not a diabetic.  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  HPI  Review of Systems There is currently listed no headache, chest pain, shortness of breath, fever, chills, nausea, vomiting  Objective: Vital Signs: Ht 5\' 4"  (1.626 m)    Wt 120 lb (54.4 kg)    LMP 08/20/2008    BMI 20.60 kg/m   Physical Exam She is alert and orient x3 and in no acute distress Ortho Exam Examination of her left hip shows significant stiffness when comparing the left and right hip.  The right hip moves more smoothly.  The left hip has pain in the groin with attempts of internal and external rotation. Specialty Comments:  No specialty comments available.  Imaging: No results found. X-rays in the canopy system show severe end-stage arthritis of the left hip with superior lateral joint space narrowing as  well as large particular osteophytes and sclerotic changes.  The superior lateral joint space is bone-on-bone.  PMFS History: Patient Active Problem List   Diagnosis Date Noted   Unilateral primary osteoarthritis, left hip 09/04/2021   Left knee pain 10/10/2018   Vitamin D deficiency 01/03/2018   Former smoker 01/03/2018   Past Medical History:  Diagnosis Date   Chicken pox    COVID 07/2020   Low vitamin D level 2019   Osteopenia 06/03/2008   SYMPTOMATIC MENOPAUSAL/FEMALE CLIMACTERIC STATES 01/15/2008   Undiagnosed cardiac murmurs 01/15/2008   Was told by prior GI doctor that she had a murmur. hasnt been heard by other providers   UTI (urinary tract infection)     Family History  Problem Relation Age of Onset   Cancer Father        unknown origin/cause--he was  an alcoholic   Alcohol abuse Father        estranged from her life   Other Mother        Dec 67 with complications from Shy-Drager Syndrome   Lung cancer Maternal Grandfather        heavy smoker   Other Paternal Grandmother        she died after father's birth    Past Surgical History:  Procedure Laterality Date   BREAST SURGERY  1988   augmentation--Replaced 2013   Social History   Occupational History   Occupation: [employer]  Tobacco Use   Smoking status: Former    Packs/day: 0.10    Years: 20.00    Pack years: 2.00    Types: Cigarettes    Quit date: 08/21/2003    Years since quitting: 18.0   Smokeless tobacco: Never  Vaping Use   Vaping Use: Never used  Substance and Sexual Activity   Alcohol use: Yes    Alcohol/week: 5.0 - 7.0 standard drinks    Types: 5 - 7 Standard drinks or equivalent per week   Drug use: No   Sexual activity: Not Currently    Partners: Male    Birth control/protection: Post-menopausal

## 2021-09-26 ENCOUNTER — Other Ambulatory Visit: Payer: Self-pay | Admitting: Physician Assistant

## 2021-09-26 DIAGNOSIS — M1612 Unilateral primary osteoarthritis, left hip: Secondary | ICD-10-CM

## 2021-09-28 ENCOUNTER — Other Ambulatory Visit: Payer: Self-pay

## 2021-09-29 ENCOUNTER — Telehealth: Payer: Self-pay | Admitting: Physician Assistant

## 2021-09-29 NOTE — Telephone Encounter (Signed)
Received medical records release form.$25.00 cash and FMLA paperwork from patient /Forwarding to [facility] today

## 2021-10-06 ENCOUNTER — Encounter (HOSPITAL_COMMUNITY): Payer: Self-pay

## 2021-10-06 NOTE — Patient Instructions (Addendum)
DUE TO COVID-19 ONLY ONE VISITOR IS ALLOWED TO COME WITH YOU AND STAY IN THE WAITING ROOM ONLY DURING PRE OP AND PROCEDURE DAY OF SURGERY.   Up to two visitors ages 16+ are allowed at one time in a patient's room.  The visitors may rotate out with other people throughout the day.  Additionally, up to two children between the ages of 59 and 87 are allowed and do not count toward the number of allowed visitors.  Children within this age range must be accompanied by an adult visitor.  One adult visitor may remain with the patient overnight and must be in the room by 8 PM.  YOU NEED TO HAVE A COVID 19 TEST ON__3-1-23 @  945 am_____ THIS TEST MUST BE DONE BEFORE SURGERY,     COVID TESTING SITE    [facility] COME IN THROUGH MAIN ENTRANCE BE SEATED INT THE LOBBY AREA TO THE RIGHT AS YOU COME IN THE MAIN ENTRANCE DIAL [phone] GIVE THEM YOUR NAME AND LET THEM KNOW YOU ARE HERE FOR COVID TESTING    ONCE YOUR COVID TEST IS COMPLETED,  PLEASE Wear a mask when in public AND PRACTICE GOOD HAND HYGIENE           Your procedure is scheduled on: 10-20-21   Report to [facility] Main  Entrance   Report to admitting at      0515  AM     Call this number if you have problems the morning of surgery [phone]   Remember: NO SOLID FOOD AFTER MIDNIGHT THE NIGHT PRIOR TO SURGERY. NOTHING BY MOUTH EXCEPT CLEAR LIQUIDS UNTIL     0415 am . PLEASE FINISH ENSURE DRINK PER SURGEON ORDER  WHICH NEEDS TO BE COMPLETED AT         0415 am then nothing by mouth.     CLEAR LIQUID DIET                                                                    water Black Coffee and tea, regular and decaf No Creamer                            Plain Jell-O any favor except red or purple                                  Fruit ices (not with fruit pulp)                                      Iced Popsicles                                     Carbonated beverages, regular and diet                                     Cranberry, grape and apple juices Sports drinks like Gatorade  Lightly seasoned clear broth or consume(fat free) Sugar, honey syrup   _____________________________________________________________________    BRUSH YOUR TEETH MORNING OF SURGERY AND RINSE YOUR MOUTH OUT, NO CHEWING GUM CANDY OR MINTS.     Take these medicines the morning of surgery with A SIP OF WATER: tylenol if needed                                 You may not have any metal on your body including hair pins and              piercings  Do not wear jewelry, make-up, lotions, powders,perfumes,        deodorant             Do not wear nail polish on your fingernails or toenails .  Do not shave  48 hours prior to surgery.               Do not bring valuables to the hospital. [facility] IS NOT             RESPONSIBLE   FOR VALUABLES.  Contacts, dentures or bridgework may not be worn into surgery.  You may bring a small overnight bag with you     Patients discharged the day of surgery will not be allowed to drive home. IF YOU ARE HAVING SURGERY AND GOING HOME THE SAME DAY, YOU MUST HAVE AN ADULT TO DRIVE YOU HOME AND BE WITH YOU FOR 24 HOURS. YOU MAY GO HOME BY TAXI OR UBER OR ORTHERWISE, BUT AN ADULT MUST ACCOMPANY YOU HOME AND STAY WITH YOU FOR 24 HOURS.  Name and phone number of your driver:  Special Instructions: N/A              Please read over the following fact sheets you were given: _____________________________________________________________________             [facility] - Preparing for Surgery Before surgery, you can play an important role.  Because skin is not sterile, your skin needs to be as free of germs as possible.  You can reduce the number of germs on your skin by washing with CHG (chlorahexidine gluconate) soap before surgery.  CHG is an antiseptic cleaner which kills germs and bonds with the skin to continue killing germs even after washing. Please DO NOT use if you have an allergy to CHG or  antibacterial soaps.  If your skin becomes reddened/irritated stop using the CHG and inform your nurse when you arrive at Short Stay. Do not shave (including legs and underarms) for at least 48 hours prior to the first CHG shower.  You may shave your face/neck. Please follow these instructions carefully:  1.  Shower with CHG Soap the night before surgery and the  morning of Surgery.  2.  If you choose to wash your hair, wash your hair first as usual with your  normal  shampoo.  3.  After you shampoo, rinse your hair and body thoroughly to remove the  shampoo.                           4.  Use CHG as you would any other liquid soap.  You can apply chg directly  to the skin and wash  Gently with a scrungie or clean washcloth.  5.  Apply the CHG Soap to your body ONLY FROM THE NECK DOWN.   Do not use on face/ open                           Wound or open sores. Avoid contact with eyes, ears mouth and genitals (private parts).                       Wash face,  Genitals (private parts) with your normal soap.             6.  Wash thoroughly, paying special attention to the area where your surgery  will be performed.  7.  Thoroughly rinse your body with warm water from the neck down.  8.  DO NOT shower/wash with your normal soap after using and rinsing off  the CHG Soap.                9.  Pat yourself dry with a clean towel.            10.  Wear clean pajamas.            11.  Place clean sheets on your bed the night of your first shower and do not  sleep with pets. Day of Surgery : Do not apply any lotions/deodorants the morning of surgery.  Please wear clean clothes to the hospital/surgery center.  FAILURE TO FOLLOW THESE INSTRUCTIONS MAY RESULT IN THE CANCELLATION OF YOUR SURGERY PATIENT SIGNATURE_________________________________  NURSE SIGNATURE__________________________________  ________________________________________________________________________    Bridget Mccarthy  An incentive spirometer is a tool that can help keep your lungs clear and active. This tool measures how well you are filling your lungs with each breath. Taking long deep breaths may help reverse or decrease the chance of developing breathing (pulmonary) problems (especially infection) following: A long period of time when you are unable to move or be active. BEFORE THE PROCEDURE  If the spirometer includes an indicator to show your best effort, your nurse or respiratory therapist will set it to a desired goal. If possible, sit up straight or lean slightly forward. Try not to slouch. Hold the incentive spirometer in an upright position. INSTRUCTIONS FOR USE  Sit on the edge of your bed if possible, or sit up as far as you can in bed or on a chair. Hold the incentive spirometer in an upright position. Breathe out normally. Place the mouthpiece in your mouth and seal your lips tightly around it. Breathe in slowly and as deeply as possible, raising the piston or the ball toward the top of the column. Hold your breath for 3-5 seconds or for as long as possible. Allow the piston or ball to fall to the bottom of the column. Remove the mouthpiece from your mouth and breathe out normally. Rest for a few seconds and repeat Steps 1 through 7 at least 10 times every 1-2 hours when you are awake. Take your time and take a few normal breaths between deep breaths. The spirometer may include an indicator to show your best effort. Use the indicator as a goal to work toward during each repetition. After each set of 10 deep breaths, practice coughing to be sure your lungs are clear. If you have an incision (the cut made at the time of surgery), support your incision when coughing by placing a pillow or rolled up towels  firmly against it. Once you are able to get out of bed, walk around indoors and cough well. You may stop using the incentive spirometer when instructed by your caregiver.  RISKS AND  COMPLICATIONS Take your time so you do not get dizzy or light-headed. If you are in pain, you may need to take or ask for pain medication before doing incentive spirometry. It is harder to take a deep breath if you are having pain. AFTER USE Rest and breathe slowly and easily. It can be helpful to keep track of a log of your progress. Your caregiver can provide you with a simple table to help with this. If you are using the spirometer at home, follow these instructions: [place] IF:  You are having difficultly using the spirometer. You have trouble using the spirometer as often as instructed. Your pain medication is not giving enough relief while using the spirometer. You develop fever of 100.5 F (38.1 C) or higher. SEEK IMMEDIATE MEDICAL CARE IF:  You cough up bloody sputum that had not been present before. You develop fever of 102 F (38.9 C) or greater. You develop worsening pain at or near the incision site. MAKE SURE YOU:  Understand these instructions. Will watch your condition. Will get help right away if you are not doing well or get worse. Document Released: 12/17/2006 Document Revised: 10/29/2011 Document Reviewed: 02/17/2007 [facility] Patient Information 2014 [place], [place].   ________________________________________________________________________

## 2021-10-09 ENCOUNTER — Encounter (HOSPITAL_COMMUNITY)
Admission: RE | Admit: 2021-10-09 | Discharge: 2021-10-09 | Disposition: A | Discharge status: Home / Self Care | Payer: BC Managed Care – PPO | Source: Ambulatory Visit | Attending: Orthopaedic Surgery | Admitting: Orthopaedic Surgery

## 2021-10-09 ENCOUNTER — Other Ambulatory Visit: Payer: Self-pay

## 2021-10-09 ENCOUNTER — Encounter (HOSPITAL_COMMUNITY): Payer: Self-pay

## 2021-10-09 VITALS — BP 138/77 | HR 85 | Temp 98.4°F | Resp 16 | Ht 64.0 in | Wt 121.0 lb

## 2021-10-09 DIAGNOSIS — M1612 Unilateral primary osteoarthritis, left hip: Secondary | ICD-10-CM | POA: Diagnosis not present

## 2021-10-09 DIAGNOSIS — Z01812 Encounter for preprocedural laboratory examination: Secondary | ICD-10-CM | POA: Insufficient documentation

## 2021-10-09 DIAGNOSIS — Z01818 Encounter for other preprocedural examination: Secondary | ICD-10-CM

## 2021-10-09 HISTORY — DX: Anemia, unspecified: D64.9

## 2021-10-09 HISTORY — DX: Unspecified osteoarthritis, unspecified site: M19.90

## 2021-10-09 LAB — CBC
HCT: 39 % (ref 36.0–46.0)
Hemoglobin: 12.9 g/dL (ref 12.0–15.0)
MCH: 33.3 pg (ref 26.0–34.0)
MCHC: 33.1 g/dL (ref 30.0–36.0)
MCV: 100.8 fL — ABNORMAL HIGH (ref 80.0–100.0)
Platelets: 322 10*3/uL (ref 150–400)
RBC: 3.87 MIL/uL (ref 3.87–5.11)
RDW: 13.2 % (ref 11.5–15.5)
WBC: 5.6 10*3/uL (ref 4.0–10.5)
nRBC: 0 % (ref 0.0–0.2)

## 2021-10-09 LAB — TYPE AND SCREEN
ABO/RH(D): A POS
Antibody Screen: NEGATIVE

## 2021-10-09 LAB — SURGICAL PCR SCREEN
MRSA, PCR: NEGATIVE
Staphylococcus aureus: NEGATIVE

## 2021-10-09 NOTE — Progress Notes (Signed)
66 y.o. G87P0000 Married Caucasian female here for annual exam.    No GYN concerns.   Patient scheduled for left hip replacement 10-20-21. Dealing with pain.   PCP:  Tana Conch, MD   Patient's last menstrual period was 08/20/2008.           Sexually active: No.  The current method of family planning is post menopausal status.    Exercising: No.   Due to hip issues Smoker:  no  Health Maintenance: Pap:  10/03/18 Neg:Neg HR HPV, 09/01/15 Neg:Neg HR HPV History of abnormal Pap:  yes, hx of cryotherapy years ago?  Patient is unclear about her treatment in the past. She also reports a painful procedure in the past which may have been a sonohysterogram.  MMG:  03-13-21 Implants/Neg/Birads1 Colonoscopy:  10/07/15;10 years BMD:   03-13-21  Result :Osteopenia TDaP:  10-03-18 Gardasil:   n/a HIV: 09-01-15 NR Hep C: 09-01-15 Neg Screening Labs:  PCP.  Flu vaccine:  completed.  Covid vaccine:  one booster.    reports that she quit smoking about 18 years ago. Her smoking use included cigarettes. She has a 2.00 pack-year smoking history. She has never used smokeless tobacco. She reports that she does not currently use alcohol. She reports that she does not use drugs.  Past Medical History:  Diagnosis Date   Anemia    hx of   Arthritis    Chicken pox    COVID 07/2020   Low vitamin D level 2019   Osteopenia 06/03/2008   SYMPTOMATIC MENOPAUSAL/FEMALE CLIMACTERIC STATES 01/15/2008   UTI (urinary tract infection)     Past Surgical History:  Procedure Laterality Date   BREAST SURGERY  1988   augmentation--Replaced 2013    Current Outpatient Medications  Medication Sig Dispense Refill   acetaminophen (TYLENOL) 500 MG tablet Take 500-1,000 mg by mouth every 6 (six) hours as needed for moderate pain.     celecoxib (CELEBREX) 100 MG capsule Take 100 mg by mouth 2 (two) times daily as needed for pain.     Multiple Vitamin (MULTIVITAMIN) tablet Take 1 tablet by mouth daily.     No current  facility-administered medications for this visit.    Family History  Problem Relation Age of Onset   Cancer Father        unknown origin/cause--he was an alcoholic   Alcohol abuse Father        estranged from her life   Other Mother        Dec 67 with complications from Shy-Drager Syndrome   Lung cancer Maternal Grandfather        heavy smoker   Other Paternal Grandmother        she died after father's birth    Review of Systems  All other systems reviewed and are negative.  Exam:   BP 126/60    Pulse 71    Ht 5' 3.5" (1.613 m)    Wt 122 lb (55.3 kg)    LMP 08/20/2008    SpO2 97%    BMI 21.27 kg/m     General appearance: alert, cooperative and appears stated age Head: normocephalic, without obvious abnormality, atraumatic Neck: no adenopathy, supple, symmetrical, trachea midline and thyroid normal to inspection and palpation Lungs: clear to auscultation bilaterally Breasts: normal appearance, bilateral implants, no masses or tenderness, No nipple retraction or dimpling, No nipple discharge or bleeding, No axillary adenopathy Heart: regular rate and rhythm Abdomen: soft, non-tender; no masses, no organomegaly Extremities: extremities normal, atraumatic,  no cyanosis or edema Skin: skin color, texture, turgor normal. No rashes or lesions Lymph nodes: cervical, supraclavicular, and axillary nodes normal. Neurologic: grossly normal  Pelvic: External genitalia:  no lesions              No abnormal inguinal nodes palpated.              Urethra:  normal appearing urethra with no masses, tenderness or lesions              Bartholins and Skenes: normal                 Vagina:  generalized erythema and orangish vaginal discharge.               Cervix: no lesions              Pap taken: yes Bimanual Exam:  Uterus:  normal size, contour, position, consistency, mobility, non-tender              Adnexa: no mass, fullness, tenderness              Rectal exam: yes.  Confirms.               Anus:  normal sphincter tone, no lesions  Chaperone was present for exam:  Ladona Ridgel, CMA  Assessment:   Well woman visit with gynecologic exam. 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  Hx abnormal pap. Atrophy of vagina.    Osteopenia.  Vit D deficiency.  Plan: Mammogram screening discussed. Self breast awareness reviewed. Pap and HR HPV collected.    Guidelines for Calcium, Vitamin D, regular exercise program including cardiovascular and weight bearing exercise. BMD at the end of this year.  Follow up annually and prn.   After visit summary provided.

## 2021-10-09 NOTE — Progress Notes (Addendum)
PCP - Garret Reddish LOV 05-18-21 Cardiologist - no  PPM/ICD -  Device Orders -  Rep Notified -   Chest x-ray -  EKG -  Stress Test -  ECHO -  Cardiac Cath -   Sleep Study -  CPAP -   Fasting Blood Sugar -  Checks Blood Sugar _____ times a day  Blood Thinner Instructions: Aspirin Instructions:  ERAS Protcol - PRE-SURGERY Ensure or G2-   COVID TEST- 10-18-21 COVID vaccine -x 3 pfizer   Activity--Able to walk a flight of stairs without SOB Anesthesia review:   Patient denies shortness of breath, fever, cough and chest pain at PAT appointment   All instructions explained to the patient, with a verbal understanding of the material. Patient agrees to go over the instructions while at home for a better understanding. Patient also instructed to self quarantine after being tested for COVID-19. The opportunity to ask questions was provided.

## 2021-10-10 ENCOUNTER — Encounter: Payer: Self-pay | Admitting: Obstetrics and Gynecology

## 2021-10-10 ENCOUNTER — Other Ambulatory Visit (HOSPITAL_COMMUNITY)
Admission: RE | Admit: 2021-10-10 | Discharge: 2021-10-10 | Disposition: A | Discharge status: Home / Self Care | Payer: BC Managed Care – PPO | Source: Ambulatory Visit | Attending: Obstetrics and Gynecology | Admitting: Obstetrics and Gynecology

## 2021-10-10 ENCOUNTER — Ambulatory Visit (INDEPENDENT_AMBULATORY_CARE_PROVIDER_SITE_OTHER): Payer: BC Managed Care – PPO | Admitting: Obstetrics and Gynecology

## 2021-10-10 VITALS — BP 126/60 | HR 71 | Ht 63.5 in | Wt 122.0 lb

## 2021-10-10 DIAGNOSIS — Z124 Encounter for screening for malignant neoplasm of cervix: Secondary | ICD-10-CM | POA: Insufficient documentation

## 2021-10-10 DIAGNOSIS — M8580 Other specified disorders of bone density and structure, unspecified site: Secondary | ICD-10-CM

## 2021-10-10 DIAGNOSIS — Z01419 Encounter for gynecological examination (general) (routine) without abnormal findings: Secondary | ICD-10-CM | POA: Diagnosis not present

## 2021-10-10 DIAGNOSIS — Z8742 Personal history of other diseases of the female genital tract: Secondary | ICD-10-CM

## 2021-10-10 NOTE — Patient Instructions (Signed)

## 2021-10-13 LAB — CYTOLOGY - PAP
Comment: NEGATIVE
Diagnosis: NEGATIVE
High risk HPV: NEGATIVE

## 2021-10-18 ENCOUNTER — Other Ambulatory Visit: Payer: Self-pay

## 2021-10-18 ENCOUNTER — Encounter (HOSPITAL_COMMUNITY)
Admission: RE | Admit: 2021-10-18 | Discharge: 2021-10-18 | Disposition: A | Discharge status: Home / Self Care | Payer: BC Managed Care – PPO | Source: Ambulatory Visit | Attending: Orthopaedic Surgery | Admitting: Orthopaedic Surgery

## 2021-10-18 DIAGNOSIS — Z01812 Encounter for preprocedural laboratory examination: Secondary | ICD-10-CM | POA: Diagnosis not present

## 2021-10-18 DIAGNOSIS — Z01818 Encounter for other preprocedural examination: Secondary | ICD-10-CM

## 2021-10-18 DIAGNOSIS — Z20822 Contact with and (suspected) exposure to covid-19: Secondary | ICD-10-CM | POA: Insufficient documentation

## 2021-10-18 LAB — SARS CORONAVIRUS 2 (TAT 6-24 HRS): SARS Coronavirus 2: NEGATIVE

## 2021-10-19 NOTE — H&P (Signed)
TOTAL HIP ADMISSION H&P ? ?Patient is admitted for left total hip arthroplasty. ? ?Subjective: ? ?Chief Complaint: left hip pain ? ?HPI: Bridget Mccarthy, 66 y.o. female, has a history of pain and functional disability in the left hip(s) due to arthritis and patient has failed non-surgical conservative treatments for greater than 12 weeks to include NSAID's and/or analgesics, corticosteriod injections, and activity modification.  Onset of symptoms was gradual starting 3 years ago with gradually worsening course since that time.The patient noted no past surgery on the left hip(s).  Patient currently rates pain in the left hip at 10 out of 10 with activity. Patient has night pain, worsening of pain with activity and weight bearing, pain that interfers with activities of daily living, and pain with passive range of motion. Patient has evidence of subchondral cysts, subchondral sclerosis, periarticular osteophytes, and joint space narrowing by imaging studies. This condition presents safety issues increasing the risk of falls.  There is no current active infection. ? ?Patient Active Problem List  ? Diagnosis Date Noted  ? Unilateral primary osteoarthritis, left hip 09/04/2021  ? Left knee pain 10/10/2018  ? Vitamin D deficiency 01/03/2018  ? Former smoker 01/03/2018  ? ?Past Medical History:  ?Diagnosis Date  ? Anemia   ? hx of  ? Arthritis   ? Chicken pox   ? COVID 07/2020  ? Low vitamin D level 2019  ? Osteopenia 06/03/2008  ? SYMPTOMATIC MENOPAUSAL/FEMALE CLIMACTERIC STATES 01/15/2008  ? UTI (urinary tract infection)   ?  ?Past Surgical History:  ?Procedure Laterality Date  ? BREAST SURGERY  1988  ? augmentation--Replaced 2013  ?  ?No current facility-administered medications for this encounter.  ? ?Current Outpatient Medications  ?Medication Sig Dispense Refill Last Dose  ? acetaminophen (TYLENOL) 500 MG tablet Take 500-1,000 mg by mouth every 6 (six) hours as needed for moderate pain.     ? celecoxib  (CELEBREX) 100 MG capsule Take 100 mg by mouth 2 (two) times daily as needed for pain.     ? Multiple Vitamin (MULTIVITAMIN) tablet Take 1 tablet by mouth daily.   Not Taking  ? ?Allergies  ?Allergen Reactions  ? Shrimp [Shellfish Allergy] Anaphylaxis  ?  Throat swelling- likely anaphylaxis. Didn't have to take benadryl or otherthough and resolved  ?  ?Social History  ? ?Tobacco Use  ? Smoking status: Former  ?  Packs/day: 0.10  ?  Years: 20.00  ?  Pack years: 2.00  ?  Types: Cigarettes  ?  Quit date: 08/21/2003  ?  Years since quitting: 18.1  ? Smokeless tobacco: Never  ?Substance Use Topics  ? Alcohol use: Not Currently  ?  Comment: 1-2 a week  ?  ?Family History  ?Problem Relation Age of Onset  ? Cancer Father   ?     unknown origin/cause--he was an alcoholic  ? Alcohol abuse Father   ?     estranged from her life  ? Other Mother   ?     Dec 67 with complications from Shy-Drager Syndrome  ? Lung cancer Maternal Grandfather   ?     heavy smoker  ? Other Paternal Grandmother   ?     she died after father's birth  ?  ? ?Review of Systems  ?Musculoskeletal:  Positive for gait problem.  ?All other systems reviewed and are negative. ? ?Objective: ? ?Physical Exam ?Vitals reviewed.  ?Constitutional:   ?   Appearance: Normal appearance.  ?HENT:  ?  Head: Normocephalic and atraumatic.  ?Eyes:  ?   Extraocular Movements: Extraocular movements intact.  ?   Pupils: Pupils are equal, round, and reactive to light.  ?Cardiovascular:  ?   Rate and Rhythm: Normal rate and regular rhythm.  ?   Pulses: Normal pulses.  ?Pulmonary:  ?   Effort: Pulmonary effort is normal.  ?   Breath sounds: Normal breath sounds.  ?Abdominal:  ?   Palpations: Abdomen is soft.  ?Musculoskeletal:  ?   Cervical back: Normal range of motion and neck supple.  ?   Left hip: Tenderness and bony tenderness present. Decreased range of motion. Decreased strength.  ?Neurological:  ?   Mental Status: She is alert and oriented to person, place, and time.   ?Psychiatric:     ?   Behavior: Behavior normal.  ? ? ?Vital signs in last 24 hours: ?  ? ?Labs: ? ? ?Estimated body mass index is 21.27 kg/m? as calculated from the following: ?  Height as of 10/10/21: 5' 3.5" (1.613 m). ?  Weight as of 10/10/21: 55.3 kg. ? ? ?Imaging Review ?Plain radiographs demonstrate severe degenerative joint disease of the left hip(s). The bone quality appears to be good for age and reported activity level. ? ? ? ? ? ?Assessment/Plan: ? ?End stage arthritis, left hip(s) ? ?The patient history, physical examination, clinical judgement of the provider and imaging studies are consistent with end stage degenerative joint disease of the left hip(s) and total hip arthroplasty is deemed medically necessary. The treatment options including medical management, injection therapy, arthroscopy and arthroplasty were discussed at length. The risks and benefits of total hip arthroplasty were presented and reviewed. The risks due to aseptic loosening, infection, stiffness, dislocation/subluxation,  thromboembolic complications and other imponderables were discussed.  The patient acknowledged the explanation, agreed to proceed with the plan and consent was signed. Patient is being admitted for inpatient treatment for surgery, pain control, PT, OT, prophylactic antibiotics, VTE prophylaxis, progressive ambulation and ADL's and discharge planning.The patient is planning to be discharged home with home health services ? ? ? ?

## 2021-10-20 ENCOUNTER — Ambulatory Visit (HOSPITAL_COMMUNITY): Payer: BC Managed Care – PPO

## 2021-10-20 ENCOUNTER — Observation Stay (HOSPITAL_COMMUNITY): Payer: BC Managed Care – PPO

## 2021-10-20 ENCOUNTER — Ambulatory Visit (HOSPITAL_COMMUNITY): Payer: BC Managed Care – PPO | Admitting: Certified Registered Nurse Anesthetist

## 2021-10-20 ENCOUNTER — Other Ambulatory Visit: Payer: Self-pay

## 2021-10-20 ENCOUNTER — Encounter (HOSPITAL_COMMUNITY): Payer: Self-pay | Admitting: Orthopaedic Surgery

## 2021-10-20 ENCOUNTER — Observation Stay (HOSPITAL_COMMUNITY)
Admission: RE | Admit: 2021-10-20 | Discharge: 2021-10-22 | Disposition: A | Discharge status: Home Health | Payer: BC Managed Care – PPO | Source: Ambulatory Visit | Attending: Orthopaedic Surgery | Admitting: Orthopaedic Surgery

## 2021-10-20 ENCOUNTER — Encounter (HOSPITAL_COMMUNITY)
Admission: RE | Disposition: A | Discharge status: Home Health | Payer: Self-pay | Source: Ambulatory Visit | Attending: Orthopaedic Surgery

## 2021-10-20 DIAGNOSIS — M1612 Unilateral primary osteoarthritis, left hip: Secondary | ICD-10-CM | POA: Diagnosis not present

## 2021-10-20 DIAGNOSIS — Z87891 Personal history of nicotine dependence: Secondary | ICD-10-CM | POA: Diagnosis not present

## 2021-10-20 DIAGNOSIS — Z8616 Personal history of COVID-19: Secondary | ICD-10-CM | POA: Insufficient documentation

## 2021-10-20 DIAGNOSIS — Z96642 Presence of left artificial hip joint: Secondary | ICD-10-CM

## 2021-10-20 DIAGNOSIS — Z419 Encounter for procedure for purposes other than remedying health state, unspecified: Secondary | ICD-10-CM

## 2021-10-20 HISTORY — PX: TOTAL HIP ARTHROPLASTY: SHX124

## 2021-10-20 LAB — ABO/RH: ABO/RH(D): A POS

## 2021-10-20 SURGERY — ARTHROPLASTY, HIP, TOTAL, ANTERIOR APPROACH
Anesthesia: Monitor Anesthesia Care | Site: Hip | Laterality: Left

## 2021-10-20 MED ORDER — OXYCODONE HCL 5 MG/5ML PO SOLN: 5.0000 mg | Freq: Once | ORAL | Status: AC | PRN

## 2021-10-20 MED ORDER — TRANEXAMIC ACID-NACL 1000-0.7 MG/100ML-% IV SOLN
1000.0000 mg | INTRAVENOUS | Status: AC
  Administered 2021-10-20: 1000 mg via INTRAVENOUS
  Filled 2021-10-20: qty 100

## 2021-10-20 MED ORDER — FENTANYL CITRATE PF 50 MCG/ML IJ SOSY
PREFILLED_SYRINGE | INTRAMUSCULAR | Status: AC
  Filled 2021-10-20: qty 1

## 2021-10-20 MED ORDER — METHOCARBAMOL 500 MG IVPB - SIMPLE MED
INTRAVENOUS | Status: AC
  Filled 2021-10-20: qty 50

## 2021-10-20 MED ORDER — DOCUSATE SODIUM 100 MG PO CAPS
100.0000 mg | ORAL_CAPSULE | Freq: Two times a day (BID) | ORAL | Status: DC
  Administered 2021-10-20 – 2021-10-22 (×4): 100 mg via ORAL
  Filled 2021-10-20 (×4): qty 1

## 2021-10-20 MED ORDER — DEXAMETHASONE SODIUM PHOSPHATE 4 MG/ML IJ SOLN
INTRAMUSCULAR | Status: DC | PRN
  Administered 2021-10-20: 8 mg via INTRAVENOUS

## 2021-10-20 MED ORDER — FENTANYL CITRATE (PF) 100 MCG/2ML IJ SOLN
INTRAMUSCULAR | Status: DC | PRN
  Administered 2021-10-20: 100 ug via INTRAVENOUS

## 2021-10-20 MED ORDER — ORAL CARE MOUTH RINSE: 15.0000 mL | Freq: Once | OROMUCOSAL | Status: AC

## 2021-10-20 MED ORDER — MIDAZOLAM HCL 2 MG/2ML IJ SOLN
INTRAMUSCULAR | Status: AC
  Filled 2021-10-20: qty 2

## 2021-10-20 MED ORDER — CHLORHEXIDINE GLUCONATE 0.12 % MT SOLN
15.0000 mL | Freq: Once | OROMUCOSAL | Status: AC
  Administered 2021-10-20: 15 mL via OROMUCOSAL

## 2021-10-20 MED ORDER — ONDANSETRON HCL 4 MG/2ML IJ SOLN: 4.0000 mg | Freq: Four times a day (QID) | INTRAMUSCULAR | Status: DC | PRN

## 2021-10-20 MED ORDER — DIPHENHYDRAMINE HCL 12.5 MG/5ML PO ELIX: 12.5000 mg | ORAL_SOLUTION | ORAL | Status: DC | PRN

## 2021-10-20 MED ORDER — PHENYLEPHRINE HCL (PRESSORS) 10 MG/ML IV SOLN
INTRAVENOUS | Status: DC | PRN
  Administered 2021-10-20 (×3): 80 ug via INTRAVENOUS

## 2021-10-20 MED ORDER — PROPOFOL 500 MG/50ML IV EMUL
INTRAVENOUS | Status: DC | PRN
  Administered 2021-10-20: 75 ug/kg/min via INTRAVENOUS

## 2021-10-20 MED ORDER — MENTHOL 3 MG MT LOZG: 1.0000 | LOZENGE | OROMUCOSAL | Status: DC | PRN

## 2021-10-20 MED ORDER — OXYCODONE HCL 5 MG PO TABS
ORAL_TABLET | ORAL | Status: AC
  Administered 2021-10-20: 5 mg via ORAL
  Filled 2021-10-20: qty 1

## 2021-10-20 MED ORDER — STERILE WATER FOR IRRIGATION IR SOLN
Status: DC | PRN
  Administered 2021-10-20: 2000 mL

## 2021-10-20 MED ORDER — METHOCARBAMOL 500 MG PO TABS
500.0000 mg | ORAL_TABLET | Freq: Four times a day (QID) | ORAL | Status: DC | PRN
  Administered 2021-10-20 – 2021-10-21 (×3): 500 mg via ORAL
  Filled 2021-10-20 (×3): qty 1

## 2021-10-20 MED ORDER — METHOCARBAMOL 500 MG IVPB - SIMPLE MED
500.0000 mg | Freq: Four times a day (QID) | INTRAVENOUS | Status: DC | PRN
  Administered 2021-10-20: 500 mg via INTRAVENOUS
  Filled 2021-10-20: qty 50

## 2021-10-20 MED ORDER — METOCLOPRAMIDE HCL 5 MG/ML IJ SOLN: 5.0000 mg | Freq: Three times a day (TID) | INTRAMUSCULAR | Status: DC | PRN

## 2021-10-20 MED ORDER — PANTOPRAZOLE SODIUM 40 MG PO TBEC
40.0000 mg | DELAYED_RELEASE_TABLET | Freq: Every day | ORAL | Status: DC
  Administered 2021-10-20 – 2021-10-22 (×3): 40 mg via ORAL
  Filled 2021-10-20 (×3): qty 1

## 2021-10-20 MED ORDER — PHENOL 1.4 % MT LIQD: 1.0000 | OROMUCOSAL | Status: DC | PRN

## 2021-10-20 MED ORDER — SODIUM CHLORIDE 0.9 % IV SOLN: INTRAVENOUS | Status: DC

## 2021-10-20 MED ORDER — METOCLOPRAMIDE HCL 5 MG PO TABS: 5.0000 mg | ORAL_TABLET | Freq: Three times a day (TID) | ORAL | Status: DC | PRN

## 2021-10-20 MED ORDER — ALUM & MAG HYDROXIDE-SIMETH 200-200-20 MG/5ML PO SUSP: 30.0000 mL | ORAL | Status: DC | PRN

## 2021-10-20 MED ORDER — SODIUM CHLORIDE 0.9 % IR SOLN
Status: DC | PRN
Start: 2021-10-20 — End: 2021-10-20
  Administered 2021-10-20: 1000 mL

## 2021-10-20 MED ORDER — MIDAZOLAM HCL 5 MG/5ML IJ SOLN
INTRAMUSCULAR | Status: DC | PRN
  Administered 2021-10-20: 2 mg via INTRAVENOUS

## 2021-10-20 MED ORDER — FENTANYL CITRATE PF 50 MCG/ML IJ SOSY
PREFILLED_SYRINGE | INTRAMUSCULAR | Status: AC
  Administered 2021-10-20: 50 ug via INTRAVENOUS
  Filled 2021-10-20: qty 1

## 2021-10-20 MED ORDER — HYDROMORPHONE HCL 1 MG/ML IJ SOLN: 0.5000 mg | INTRAMUSCULAR | Status: DC | PRN

## 2021-10-20 MED ORDER — OXYCODONE HCL 5 MG PO TABS
5.0000 mg | ORAL_TABLET | ORAL | Status: DC | PRN
  Administered 2021-10-20 – 2021-10-22 (×4): 10 mg via ORAL
  Filled 2021-10-20 (×4): qty 2

## 2021-10-20 MED ORDER — ASPIRIN 81 MG PO CHEW
81.0000 mg | CHEWABLE_TABLET | Freq: Two times a day (BID) | ORAL | Status: DC
  Administered 2021-10-20 – 2021-10-22 (×4): 81 mg via ORAL
  Filled 2021-10-20 (×4): qty 1

## 2021-10-20 MED ORDER — LACTATED RINGERS IV SOLN: INTRAVENOUS | Status: DC

## 2021-10-20 MED ORDER — OXYCODONE HCL 5 MG PO TABS
10.0000 mg | ORAL_TABLET | ORAL | Status: DC | PRN
  Administered 2021-10-20: 15 mg via ORAL
  Administered 2021-10-21: 10 mg via ORAL
  Filled 2021-10-20: qty 2
  Filled 2021-10-20: qty 3

## 2021-10-20 MED ORDER — POVIDONE-IODINE 10 % EX SWAB: 2.0000 "application " | Freq: Once | CUTANEOUS | Status: DC

## 2021-10-20 MED ORDER — FENTANYL CITRATE PF 50 MCG/ML IJ SOSY
25.0000 ug | PREFILLED_SYRINGE | INTRAMUSCULAR | Status: DC | PRN
  Administered 2021-10-20: 50 ug via INTRAVENOUS

## 2021-10-20 MED ORDER — CEFAZOLIN SODIUM-DEXTROSE 2-4 GM/100ML-% IV SOLN
2.0000 g | INTRAVENOUS | Status: AC
  Administered 2021-10-20: 2 g via INTRAVENOUS
  Filled 2021-10-20: qty 100

## 2021-10-20 MED ORDER — ACETAMINOPHEN 325 MG PO TABS
325.0000 mg | ORAL_TABLET | Freq: Four times a day (QID) | ORAL | Status: DC | PRN
  Administered 2021-10-21 – 2021-10-22 (×2): 650 mg via ORAL
  Filled 2021-10-20 (×2): qty 2

## 2021-10-20 MED ORDER — 0.9 % SODIUM CHLORIDE (POUR BTL) OPTIME
TOPICAL | Status: DC | PRN
  Administered 2021-10-20: 1000 mL

## 2021-10-20 MED ORDER — OXYCODONE HCL 5 MG PO TABS: 5.0000 mg | ORAL_TABLET | Freq: Once | ORAL | Status: AC | PRN

## 2021-10-20 MED ORDER — BUPIVACAINE IN DEXTROSE 0.75-8.25 % IT SOLN
INTRATHECAL | Status: DC | PRN
  Administered 2021-10-20: 1.6 mL via INTRATHECAL

## 2021-10-20 MED ORDER — ONDANSETRON HCL 4 MG/2ML IJ SOLN
4.0000 mg | Freq: Four times a day (QID) | INTRAMUSCULAR | Status: DC | PRN
  Administered 2021-10-21: 4 mg via INTRAVENOUS
  Filled 2021-10-20: qty 2

## 2021-10-20 MED ORDER — ONDANSETRON HCL 4 MG/2ML IJ SOLN
INTRAMUSCULAR | Status: DC | PRN
  Administered 2021-10-20: 4 mg via INTRAVENOUS

## 2021-10-20 MED ORDER — FENTANYL CITRATE (PF) 100 MCG/2ML IJ SOLN
INTRAMUSCULAR | Status: AC
  Filled 2021-10-20: qty 2

## 2021-10-20 MED ORDER — ONDANSETRON HCL 4 MG PO TABS: 4.0000 mg | ORAL_TABLET | Freq: Four times a day (QID) | ORAL | Status: DC | PRN

## 2021-10-20 MED ORDER — CEFAZOLIN SODIUM-DEXTROSE 1-4 GM/50ML-% IV SOLN
1.0000 g | Freq: Four times a day (QID) | INTRAVENOUS | Status: AC
  Administered 2021-10-20 (×2): 1 g via INTRAVENOUS
  Filled 2021-10-20 (×2): qty 50

## 2021-10-20 SURGICAL SUPPLY — 43 items
APL SKNCLS STERI-STRIP NONHPOA (GAUZE/BANDAGES/DRESSINGS)
BAG COUNTER SPONGE SURGICOUNT (BAG) ×3 IMPLANT
BAG SPEC THK2 15X12 ZIP CLS (MISCELLANEOUS)
BAG SPNG CNTER NS LX DISP (BAG) ×1
BAG ZIPLOCK 12X15 (MISCELLANEOUS) IMPLANT
BENZOIN TINCTURE PRP APPL 2/3 (GAUZE/BANDAGES/DRESSINGS) IMPLANT
BLADE SAW SGTL 18X1.27X75 (BLADE) ×3 IMPLANT
COVER PERINEAL POST (MISCELLANEOUS) ×3 IMPLANT
COVER SURGICAL LIGHT HANDLE (MISCELLANEOUS) ×3 IMPLANT
DRAPE FOOT SWITCH (DRAPES) ×3 IMPLANT
DRAPE STERI IOBAN 125X83 (DRAPES) ×3 IMPLANT
DRAPE U-SHAPE 47X51 STRL (DRAPES) ×6 IMPLANT
DRSG AQUACEL AG ADV 3.5X10 (GAUZE/BANDAGES/DRESSINGS) ×3 IMPLANT
DURAPREP 26ML APPLICATOR (WOUND CARE) ×3 IMPLANT
ELECT REM PT RETURN 15FT ADLT (MISCELLANEOUS) ×3 IMPLANT
GAUZE XEROFORM 1X8 LF (GAUZE/BANDAGES/DRESSINGS) ×3 IMPLANT
GLOVE SRG 8 PF TXTR STRL LF DI (GLOVE) ×4 IMPLANT
GLOVE SURG ENC MOIS LTX SZ7.5 (GLOVE) ×3 IMPLANT
GLOVE SURG NEOPR MICRO LF SZ8 (GLOVE) ×3 IMPLANT
GLOVE SURG UNDER POLY LF SZ8 (GLOVE) ×4
GOWN STRL REUS W/TWL XL LVL3 (GOWN DISPOSABLE) ×6 IMPLANT
HANDPIECE INTERPULSE COAX TIP (DISPOSABLE) ×2
HEAD CERAMIC DELTA 36 PLUS 1.5 (Hips) ×1 IMPLANT
HOLDER FOLEY CATH W/STRAP (MISCELLANEOUS) ×3 IMPLANT
KIT TURNOVER KIT A (KITS) IMPLANT
LINER NEUTRAL 52X36MM PLUS 4 (Liner) ×1 IMPLANT
PACK ANTERIOR HIP CUSTOM (KITS) ×3 IMPLANT
PIN SECTOR W/GRIP ACE CUP 52MM (Hips) ×1 IMPLANT
SET HNDPC FAN SPRY TIP SCT (DISPOSABLE) ×2 IMPLANT
SPONGE T-LAP 18X18 ~~LOC~~+RFID (SPONGE) ×9 IMPLANT
STAPLER VISISTAT 35W (STAPLE) IMPLANT
STEM FEMORAL SZ5 HIGH ACTIS (Stem) ×1 IMPLANT
STRIP CLOSURE SKIN 1/2X4 (GAUZE/BANDAGES/DRESSINGS) IMPLANT
SUT ETHIBOND NAB CT1 #1 30IN (SUTURE) ×3 IMPLANT
SUT ETHILON 2 0 PS N (SUTURE) IMPLANT
SUT MNCRL AB 4-0 PS2 18 (SUTURE) IMPLANT
SUT VIC AB 0 CT1 36 (SUTURE) ×3 IMPLANT
SUT VIC AB 1 CT1 36 (SUTURE) ×3 IMPLANT
SUT VIC AB 2-0 CT1 27 (SUTURE) ×4
SUT VIC AB 2-0 CT1 TAPERPNT 27 (SUTURE) ×4 IMPLANT
TRAY FOLEY MTR SLVR 16FR STAT (SET/KITS/TRAYS/PACK) IMPLANT
TRAY FOLEY W/BAG SLVR 14FR LF (SET/KITS/TRAYS/PACK) ×1 IMPLANT
YANKAUER SUCT BULB TIP NO VENT (SUCTIONS) ×3 IMPLANT

## 2021-10-20 NOTE — Op Note (Signed)
NAME: Bridget Mccarthy, Bridget Mccarthy ?MEDICAL RECORD NO: [id number] ?ACCOUNT NO: [id number] ?DATE OF BIRTH: [birth year] ?FACILITY: [facility] ?LOCATION: [facility] ?PHYSICIAN: Vanita Panda. Magnus Ivan, MD ? ?Operative Report  ? ?DATE OF PROCEDURE: 10/20/2021 ? ?PREOPERATIVE DIAGNOSES:  Primary osteoarthritis and degenerative joint disease of left hip. ? ?POSTOPERATIVE DIAGNOSES:  Primary osteoarthritis and degenerative joint disease of left hip. ? ?PROCEDURE:  Left total hip arthroplasty through direct anterior approach. ? ?IMPLANTS:  DePuy Sector Gription acetabular component size 52, size 36+4 polyethylene liner, size 5 ACTIS femoral component with high offset, size 36+1.5 ceramic hip ball. ? ?SURGEON:  Vanita Panda. Magnus Ivan, MD ? ?ASSISTANT:  Rexene Edison, PA-C ? ?ANESTHESIA:  Spinal. ? ?ANTIBIOTICS:  2 g IV Ancef. ? ?BLOOD LOSS:  100 mL. ? ?COMPLICATIONS:  None. ? ?INDICATIONS:  The patient is a 66 year old female with debilitating arthritis involving her left hip that is well documented on clinical exam and x-ray findings.  She has tried and failed all forms of conservative treatment.  At this point, her left hip  ?pain is daily and it is detrimentally affecting her mobility, her quality of life, and her activities of daily living.  At this point, we recommended a total hip arthroplasty and she does request this as well.  We did talk in length and detail about the  ?risk of acute blood loss anemia, nerve and vessel injury, fracture, infection, DVT, implant failure, leg length differences and skin and soft tissue issues.  We talked about our goals being decreased pain, improved mobility and overall improved quality  ?of life. ? ?DESCRIPTION OF PROCEDURE:  After informed consent was obtained, appropriate left hip was marked.  She was brought to the operating room and sat up on the stretcher where spinal anesthesia was obtained.  She was then laid in a supine position on the  ?stretcher.  Foley catheter was placed and traction boots were  placed on both her feet.  Next, she was placed supine on the Hana fracture table.  The perineal post in place and both legs in line skeletal traction devices, no traction applied.  Her left  ?operative hip was prepped and draped with DuraPrep and sterile drapes.  A timeout was called and she was identified as correct patient, correct left hip.  I then made an incision just inferior and posterior to the anterior superior iliac spine and  ?carried this obliquely down the leg.  I dissected down to the tensor fascia lata muscle and the tensor fascia was then divided longitudinally to proceed with direct anterior approach to the hip.  We identified and cauterized circumflex vessels and  ?identified the hip capsule, opened the hip capsule in an L-type format, finding moderate joint effusion and significant periarticular osteophytes around the lateral femoral head and neck.  We placed curved retractors around the medial and lateral femoral ? neck and made a femoral neck cut with an oscillating saw, proximal to the lesser trochanter and completed this with an osteotome.  We placed a corkscrew guide in the femoral head and removed the femoral head in its entirety and found a wide area devoid  ?of cartilage.  We then removed significant periarticular osteophytes around the acetabular rim as well as remnants of acetabular labrum and other debris.  I placed a bent Hohmann over the medial acetabular rim and then began reaming under direct  ?visualization from a size 43 reamer in a stepwise increments going up to a size 51 reamer with all reamers placed under direct visualization.  The last  reamer was placed under direct fluoroscopy, so we could obtain our depth of reaming, our inclination  ?and anteversion.  I then placed the real DePuy Sector Gription acetabular component size 52 and we went with a 36+4 polyethylene liner based on medialization of the cup and her offset.  Attention was then turned to the femur.  With the leg  externally  ?rotated to 120 degrees, extended and adducted, I was able to place a Mueller retractor medially and Hohmann retractor behind the greater trochanter.  I released lateral joint capsule and used a box cutting osteotome to enter the femoral canal and a  ?rongeur to lateralize, then began broaching using the ACTIS broaching system from a size 0 going to a size 5.  With a size 5 in placed, we trialed our standard offset femoral neck and a 36+1.5 hip ball, and reduced this in the acetabulum.  It was tight,  ?but we felt like based radiographically and assessment that she needed more offset.  We were pleased with positioning of the implant itself and what we had done to her leg lengths.  Of note, even if she looks like she has equal leg lengths  ?radiographically, preop, she is actually shorter clinically on the left side a little bit.  We then dislocated the hip and removed the trial components.  I went with the real high offset ACTIS femoral component size 5 and we placed it down in the canal  ?with a real 36+1.5 ceramic hip ball and again reduced this in acetabulum.  It was tight, but we did feel like we had maximized her offset and felt good about her leg lengths.  It was stable on clinical and radiographic exam.  We then irrigated the soft  ?tissue with normal saline solution.  The joint capsule was closed with interrupted #1 Ethibond suture followed by #1 Vicryl to close the tensor fascia.  0 Vicryl was used to close deep tissue and 2-0 Vicryl was used to close the subcutaneous tissue.  The ? skin was closed with staples.  An Aquacel dressing was applied.  She was taken off the Hana table and taken to recovery room in stable condition with all final counts being correct.  No complications noted.  Of note, Rexene Edison, PACs assistance was  ?necessary throughout the entire case.  His assistance was crucial from helping with opening the incision with retracting soft tissues and mobilizing soft tissues as well  as closing the incision. ? ? ?NIK ?D: 10/20/2021 8:30:42 am T: 10/20/2021 9:44:00 am  ?JOB: [id number]/ [id number]  ?

## 2021-10-20 NOTE — Anesthesia Postprocedure Evaluation (Signed)
Anesthesia Post Note ? ?Patient: Bridget Mccarthy ? ?Procedure(s) Performed: Left TOTAL HIP ARTHROPLASTY ANTERIOR APPROACH (Left: Hip) ? ?  ? ?Patient location during evaluation: PACU ?Anesthesia Type: MAC and Spinal ?Level of consciousness: oriented and awake and alert ?Pain management: pain level controlled ?Vital Signs Assessment: post-procedure vital signs reviewed and stable ?Respiratory status: spontaneous breathing, respiratory function stable and patient connected to nasal cannula oxygen ?Cardiovascular status: blood pressure returned to baseline and stable ?Postop Assessment: no headache, no backache and no apparent nausea or vomiting ?Anesthetic complications: no ? ? ?No notable events documented. ? ?Last Vitals:  ?Vitals:  ? 10/20/21 1031 10/20/21 1322  ?BP: 111/77 119/67  ?Pulse: 85 90  ?Resp:  14  ?Temp:  36.6 ?C  ?SpO2: 99% 98%  ?  ?Last Pain:  ?Vitals:  ? 10/20/21 1029  ?TempSrc:   ?PainSc: 4   ? ? ?  ?  ?  ?  ?  ?  ? ?[place] S ? ? ? ? ?

## 2021-10-20 NOTE — Interval H&P Note (Signed)
History and Physical Interval Note: The patient understands that she is here today for a left hip replacement to treat her left hip osteoarthritis.  There has been no acute or interval change in her medical status.  Please see H&P.  The risks and benefits of surgery have been explained in detail and informed consent is obtained.  The left operative hip has been marked. ? ?10/20/2021 ?7:04 AM ? ?Bridget Mccarthy  has presented today for surgery, with the diagnosis of Osteoarthritis / Degenerative Joint Diease Left Hip.  The various methods of treatment have been discussed with the patient and family. After consideration of risks, benefits and other options for treatment, the patient has consented to  Procedure(s): ?Left TOTAL HIP ARTHROPLASTY ANTERIOR APPROACH (Left) as a surgical intervention.  The patient's history has been reviewed, patient examined, no change in status, stable for surgery.  I have reviewed the patient's chart and labs.  Questions were answered to the patient's satisfaction.   ? ? ?[name] ? ? ?

## 2021-10-20 NOTE — Evaluation (Signed)
Physical Therapy Evaluation ?Patient Details ?Name: Bridget Mccarthy ?MRN: [id number] ?DOB: 1956/05/28 ?Today's Date: 10/20/2021 ? ?History of Present Illness ? Pt s/p L THR  ?Clinical Impression ? Pt s/p L THR and presents with decreased L LE strength/ROM, post op pain, and orthostatic BP limiting functional mobility.  Pt should progress to dc home with family assist.  This date pt requesting to move 2* increasing discomfort in bed but limited to ambulate very limited distance to chair 2* c/o dizziness and with BP drop to 68/43 - RN aware. ?   ? ?Recommendations for follow up therapy are one component of a multi-disciplinary discharge planning process, led by the attending physician.  Recommendations may be updated based on patient status, additional functional criteria and insurance authorization. ? ?Follow Up Recommendations Follow physician's recommendations for discharge plan and follow up therapies ? ?  ?Assistance Recommended at Discharge Intermittent Supervision/Assistance  ?Patient can return home with the following ? A little help with walking and/or transfers;A little help with bathing/dressing/bathroom;Assistance with cooking/housework;Assist for transportation;Help with stairs or ramp for entrance ? ?  ?Equipment Recommendations None recommended by PT  ?Recommendations for Other Services ?    ?  ?Functional Status Assessment Patient has had a recent decline in their functional status and demonstrates the ability to make significant improvements in function in a reasonable and predictable amount of time.  ? ?  ?Precautions / Restrictions Precautions ?Precautions: Fall ?Restrictions ?Weight Bearing Restrictions: No ?Other Position/Activity Restrictions: WBAT  ? ?  ? ?Mobility ? Bed Mobility ?Overal bed mobility: Needs Assistance ?Bed Mobility: Supine to Sit ?  ?  ?Supine to sit: Min assist ?  ?  ?General bed mobility comments: cues for sequence and use of R LE to self assist ?  ? ?Transfers ?Overall  transfer level: Needs assistance ?Equipment used: Rolling walker (2 wheels) ?Transfers: Sit to/from Stand, Bed to chair/wheelchair/BSC ?Sit to Stand: Min assist, Mod assist ?  ?  ?  ?  ?  ?General transfer comment: cues for LE management and use of UEs to self assist ?  ? ?Ambulation/Gait ?Ambulation/Gait assistance: Min assist, +2 safety/equipment ?Gait Distance (Feet): 3 Feet ?Assistive device: Rolling walker (2 wheels) ?Gait Pattern/deviations: Step-to pattern, Decreased step length - right, Decreased step length - left, Shuffle, Trunk flexed ?Gait velocity: decr ?  ?  ?General Gait Details: cues for sequence and position from RW ? ?Stairs ?  ?  ?  ?  ?  ? ?Wheelchair Mobility ?  ? ?Modified Rankin (Stroke Patients Only) ?  ? ?  ? ?Balance Overall balance assessment: Needs assistance ?Sitting-balance support: No upper extremity supported, Feet supported ?Sitting balance-Leahy Scale: Good ?  ?  ?Standing balance support: Bilateral upper extremity supported ?Standing balance-Leahy Scale: Poor ?  ?  ?  ?  ?  ?  ?  ?  ?  ?  ?  ?  ?   ? ? ? ?Pertinent Vitals/Pain Pain Assessment ?Pain Assessment: 0-10 ?Pain Score: 5  ?Pain Location: L hip ?Pain Descriptors / Indicators: Aching, Sore ?Pain Intervention(s): Limited activity within patient's tolerance, Monitored during session, Premedicated before session, Patient requesting pain meds-RN notified (pt declines ice)  ? ? ?Home Living Family/patient expects to be discharged to:: Private residence ?Living Arrangements: Spouse/significant other ?Available Help at Discharge: Family ?Type of Home: House ?Home Access: Stairs to enter ?Entrance Stairs-Rails: None ?Entrance Stairs-Number of Steps: 3 ?  ?Home Layout: One level ?Home Equipment: Rolling Walker (2 wheels);BSC/3in1 ?   ?  ?  Prior Function Prior Level of Function : Independent/Modified Independent ?  ?  ?  ?  ?  ?  ?  ?  ?  ? ? ?Hand Dominance  ?   ? ?  ?Extremity/Trunk Assessment  ? Upper Extremity Assessment ?Upper  Extremity Assessment: Overall WFL for tasks assessed ?  ? ?Lower Extremity Assessment ?Lower Extremity Assessment: LLE deficits/detail ?  ? ?Cervical / Trunk Assessment ?Cervical / Trunk Assessment: Normal  ?Communication  ? Communication: No difficulties  ?Cognition Arousal/Alertness: Awake/alert ?Behavior During Therapy: [facility] for tasks assessed/performed ?Overall Cognitive Status: Within Functional Limits for tasks assessed ?  ?  ?  ?  ?  ?  ?  ?  ?  ?  ?  ?  ?  ?  ?  ?  ?  ?  ?  ? ?  ?General Comments   ? ?  ?Exercises Total Joint Exercises ?Ankle Circles/Pumps: AROM, Both, 15 reps, Supine  ? ?Assessment/Plan  ?  ?PT Assessment Patient needs continued PT services  ?PT Problem List Decreased strength;Decreased range of motion;Decreased activity tolerance;Decreased balance;Decreased mobility;Decreased knowledge of use of DME;Pain ? ?   ?  ?PT Treatment Interventions DME instruction;Stair training;Gait training;Functional mobility training;Therapeutic activities;Therapeutic exercise;Patient/family education   ? ?PT Goals (Current goals can be found in the Care Plan section)  ?Acute Rehab PT Goals ?Patient Stated Goal: Regain iND ?PT Goal Formulation: With patient ?Time For Goal Achievement: 10/27/21 ?Potential to Achieve Goals: Good ? ?  ?Frequency 7X/week ?  ? ? ?Co-evaluation   ?  ?  ?  ?  ? ? ?  ?AM-PAC PT "6 Clicks" Mobility  ?Outcome Measure Help needed turning from your back to your side while in a flat bed without using bedrails?: A Little ?Help needed moving from lying on your back to sitting on the side of a flat bed without using bedrails?: A Little ?Help needed moving to and from a bed to a chair (including a wheelchair)?: A Little ?Help needed standing up from a chair using your arms (e.g., wheelchair or bedside chair)?: A Little ?Help needed to walk in hospital room?: Total ?Help needed climbing 3-5 steps with a railing? : Total ?6 Click Score: 14 ? ?  ?End of Session Equipment Utilized During Treatment:  Gait belt ?Activity Tolerance: Other (comment) (orthostatic) ?Patient left: in chair;with call bell/phone within reach ?Nurse Communication: Mobility status ?PT Visit Diagnosis: Unsteadiness on feet (R26.81);Difficulty in walking, not elsewhere classified (R26.2) ?  ? ?Time: [id number] ?PT Time Calculation (min) (ACUTE ONLY): 27 min ? ? ?Charges:   PT Evaluation ?$PT Eval Low Complexity: 1 Low ?PT Treatments ?$Therapeutic Activity: 8-22 mins ?  ?   ? ? ?[name] PT ?Acute Rehabilitation Services ?Pager [phone] ?Office [phone] ? ? ?Bridget Mccarthy ?10/20/2021, 1:07 PM ? ?

## 2021-10-20 NOTE — Brief Op Note (Signed)
10/20/2021 ? ?8:32 AM ? ?PATIENT:  Bridget Mccarthy  66 y.o. female ? ?PRE-OPERATIVE DIAGNOSIS:  Osteoarthritis / Degenerative Joint Diease Left Hip ? ?POST-OPERATIVE DIAGNOSIS:  Osteoarthritis / Degenerative Joint Diease Left Hip ? ?PROCEDURE:  Procedure(s): ?Left TOTAL HIP ARTHROPLASTY ANTERIOR APPROACH (Left) ? ?SURGEON:  Surgeon(s) and Role: ?   [name], MD - Primary ? ?PHYSICIAN ASSISTANT:  Benita Stabile, PA-C ? ?ANESTHESIA:   spinal ? ?EBL: 100 ml ? ?COUNTS:  YES ? ?DICTATION: .Other Dictation: Dictation Number [id number] ? ?PLAN OF CARE: Admit for overnight observation ? ?PATIENT DISPOSITION:  PACU - hemodynamically stable. ?  ?Delay start of Pharmacological VTE agent (>24hrs) due to surgical blood loss or risk of bleeding: no ? ?

## 2021-10-20 NOTE — Transfer of Care (Signed)
Immediate Anesthesia Transfer of Care Note ? ?Patient: Bridget Mccarthy ? ?Procedure(s) Performed: Left TOTAL HIP ARTHROPLASTY ANTERIOR APPROACH (Left: Hip) ? ?Patient Location: PACU ? ?Anesthesia Type:Spinal ? ?Level of Consciousness: awake, alert  and oriented ? ?Airway & Oxygen Therapy: Patient Spontanous Breathing and Patient connected to face mask oxygen ? ?Post-op Assessment: Report given to RN and Post -op Vital signs reviewed and stable ? ?Post vital signs: Reviewed and stable ? ?Last Vitals:  ?Vitals Value Taken Time  ?BP 101/54 10/20/21 0843  ?Temp    ?Pulse 74 10/20/21 0846  ?Resp 13 10/20/21 0846  ?SpO2 100 % 10/20/21 0846  ?Vitals shown include unvalidated device data. ? ?Last Pain:  ?Vitals:  ? 10/20/21 0612  ?TempSrc:   ?PainSc: 0-No pain  ?   ? ?Patients Stated Pain Goal: 3 (10/20/21 0612) ? ?Complications: No notable events documented. ?

## 2021-10-20 NOTE — Anesthesia Preprocedure Evaluation (Signed)
Anesthesia Evaluation  ?Patient identified by MRN, date of birth, ID band ?Patient awake ? ? ? ?Reviewed: ?Allergy & Precautions, H&P , NPO status , Patient's Chart, lab work & pertinent test results ? ?Airway ?Mallampati: II ? ? ?Neck ROM: full ? ? ? Dental ?  ?Pulmonary ?former smoker,  ?  ?breath sounds clear to auscultation ? ? ? ? ? ? Cardiovascular ?negative cardio ROS ? ? ?Rhythm:regular Rate:Normal ? ? ?  ?Neuro/Psych ?  ? GI/Hepatic ?  ?Endo/Other  ? ? Renal/GU ?  ? ?  ?Musculoskeletal ? ?(+) Arthritis ,  ? Abdominal ?  ?Peds ? Hematology ?  ?Anesthesia Other Findings ? ? Reproductive/Obstetrics ? ?  ? ? ? ? ? ? ? ? ? ? ? ? ? ?  ?  ? ? ? ? ? ? ? ? ?Anesthesia Physical ?Anesthesia Plan ? ?ASA: 2 ? ?Anesthesia Plan: MAC and Spinal  ? ?Post-op Pain Management:   ? ?Induction: Intravenous ? ?PONV Risk Score and Plan: 2 and Ondansetron, Propofol infusion, Treatment may vary due to age or medical condition and Midazolam ? ?Airway Management Planned: Simple Face Mask ? ?Additional Equipment:  ? ?Intra-op Plan:  ? ?Post-operative Plan:  ? ?Informed Consent: I have reviewed the patients History and Physical, chart, labs and discussed the procedure including the risks, benefits and alternatives for the proposed anesthesia with the patient or authorized representative who has indicated his/her understanding and acceptance.  ? ? ? ?Dental advisory given ? ?Plan Discussed with: CRNA, Anesthesiologist and Surgeon ? ?Anesthesia Plan Comments:   ? ? ? ? ? ? ?Anesthesia Quick Evaluation ? ?

## 2021-10-20 NOTE — Anesthesia Procedure Notes (Signed)
Spinal ? ?Patient location during procedure: OR ?Start time: 10/20/2021 7:15 AM ?End time: 10/20/2021 7:21 AM ?Reason for block: surgical anesthesia ?Staffing ?Performed: anesthesiologist  ?Anesthesiologist: Achille Rich, MD ?Preanesthetic Checklist ?Completed: patient identified, IV checked, risks and benefits discussed, surgical consent, monitors and equipment checked, pre-op evaluation and timeout performed ?Spinal Block ?Patient position: sitting ?Prep: DuraPrep ?Patient monitoring: cardiac monitor, continuous pulse ox and blood pressure ?Approach: midline ?Location: L3-4 ?Injection technique: single-shot ?Needle ?Needle type: Pencan  ?Needle gauge: 24 G ?Needle length: 9 cm ?Assessment ?Sensory level: T10 ?Events: CSF return ?Additional Notes ?Functioning IV was confirmed and monitors were applied. Sterile prep and drape, including hand hygiene and sterile gloves were used. The patient was positioned and the spine was prepped. The skin was anesthetized with lidocaine.  Free flow of clear CSF was obtained prior to injecting local anesthetic into the CSF.  The spinal needle aspirated freely following injection.  The needle was carefully withdrawn.  The patient tolerated the procedure well.  ? ? ? ?

## 2021-10-21 DIAGNOSIS — M1612 Unilateral primary osteoarthritis, left hip: Secondary | ICD-10-CM | POA: Diagnosis not present

## 2021-10-21 LAB — CBC
HCT: 34.5 % — ABNORMAL LOW (ref 36.0–46.0)
Hemoglobin: 11.2 g/dL — ABNORMAL LOW (ref 12.0–15.0)
MCH: 33 pg (ref 26.0–34.0)
MCHC: 32.5 g/dL (ref 30.0–36.0)
MCV: 101.8 fL — ABNORMAL HIGH (ref 80.0–100.0)
Platelets: 290 10*3/uL (ref 150–400)
RBC: 3.39 MIL/uL — ABNORMAL LOW (ref 3.87–5.11)
RDW: 13.2 % (ref 11.5–15.5)
WBC: 10.9 10*3/uL — ABNORMAL HIGH (ref 4.0–10.5)
nRBC: 0 % (ref 0.0–0.2)

## 2021-10-21 LAB — BASIC METABOLIC PANEL
Anion gap: 7 (ref 5–15)
BUN: 13 mg/dL (ref 8–23)
CO2: 30 mmol/L (ref 22–32)
Calcium: 8.5 mg/dL — ABNORMAL LOW (ref 8.9–10.3)
Chloride: 96 mmol/L — ABNORMAL LOW (ref 98–111)
Creatinine, Ser: 0.54 mg/dL (ref 0.44–1.00)
GFR, Estimated: >60 mL/min (ref >60)
Glucose, Bld: 135 mg/dL — ABNORMAL HIGH (ref 70–99)
Potassium: 3.8 mmol/L (ref 3.5–5.1)
Sodium: 133 mmol/L — ABNORMAL LOW (ref 135–145)

## 2021-10-21 MED ORDER — ASPIRIN 81 MG PO CHEW: 81.0000 mg | CHEWABLE_TABLET | Freq: Two times a day (BID) | ORAL | 0 refills | Status: DC

## 2021-10-21 MED ORDER — METHOCARBAMOL 500 MG PO TABS: 500.0000 mg | ORAL_TABLET | Freq: Four times a day (QID) | ORAL | 1 refills | Status: DC | PRN

## 2021-10-21 MED ORDER — OXYCODONE HCL 5 MG PO TABS: 5.0000 mg | ORAL_TABLET | Freq: Four times a day (QID) | ORAL | 0 refills | Status: DC | PRN

## 2021-10-21 NOTE — Discharge Instructions (Signed)

## 2021-10-21 NOTE — Progress Notes (Signed)
?   10/21/21 0900  ?Level of Consciousness  ?Level of Consciousness Alert  ?MEWS COLOR  ?MEWS Score Color Green  ?Orthostatic Lying   ?BP- Lying 128/71  ?Orthostatic Sitting  ?BP- Sitting 129/72  ?Orthostatic Standing at 0 minutes  ?BP- Standing at 0 minutes 108/58  ?Orthostatic Standing at 3 minutes  ?BP- Standing at 3 minutes 94/55  ?MEWS Score  ?MEWS Temp 0  ?MEWS Systolic 0  ?MEWS Pulse 0  ?MEWS RR 0  ?MEWS LOC 0  ?MEWS Score 0  ? ?Ortho Bps during thearpy, unable to ambulate far due to ortho Bps low and dizzy.  ?

## 2021-10-21 NOTE — Progress Notes (Signed)
Physical Therapy Treatment ?Patient Details ?Name: Bridget Mccarthy ?MRN: [id number] ?DOB: 08-28-1955 ?Today's Date: 10/21/2021 ? ? ?History of Present Illness Pt s/p L THR ? ?  ?PT Comments  ? ? Pt motivated and progressing with mobility but remains orthostatic but denies all symptoms.  Pt up to bathroom for toileting and performed hygiene standing at sink before ambulating limited distance in hall.  BP sitting 113/68; standing 80/56; after short walk to bathroom 73/55; after sitting x 5 minutes 99/62 and after ambulating 80' in hall 88/63.  Max HR 117 - RN aware.    ?Recommendations for follow up therapy are one component of a multi-disciplinary discharge planning process, led by the attending physician.  Recommendations may be updated based on patient status, additional functional criteria and insurance authorization. ? ?Follow Up Recommendations ? Follow physician's recommendations for discharge plan and follow up therapies ?  ?  ?Assistance Recommended at Discharge Intermittent Supervision/Assistance  ?Patient can return home with the following A little help with walking and/or transfers;A little help with bathing/dressing/bathroom;Assistance with cooking/housework;Assist for transportation;Help with stairs or ramp for entrance ?  ?Equipment Recommendations ? None recommended by PT  ?  ?Recommendations for Other Services   ? ? ?  ?Precautions / Restrictions Precautions ?Precautions: Fall ?Restrictions ?Weight Bearing Restrictions: No ?LLE Weight Bearing: Weight bearing as tolerated  ?  ? ?Mobility ? Bed Mobility ?Overal bed mobility: Needs Assistance ?Bed Mobility: Supine to Sit ?  ?  ?Supine to sit: Min assist ?  ?  ?General bed mobility comments: Pt up in chair and and requests back to same ?  ? ?Transfers ?Overall transfer level: Needs assistance ?Equipment used: Rolling walker (2 wheels) ?Transfers: Sit to/from Stand ?Sit to Stand: Min assist ?  ?Step pivot transfers: Min assist ?  ?  ?  ?General transfer  comment: cues for LE management and use of UEs to self assist ?  ? ?Ambulation/Gait ?Ambulation/Gait assistance: Min assist ?Gait Distance (Feet): 80 Feet (and 15' into bathroom) ?Assistive device: Rolling walker (2 wheels) ?Gait Pattern/deviations: Step-to pattern, Decreased step length - right, Decreased step length - left, Shuffle, Trunk flexed ?Gait velocity: decr ?  ?  ?General Gait Details: cues for sequence and position from RW ? ? ?Stairs ?  ?  ?  ?  ?  ? ? ?Wheelchair Mobility ?  ? ?Modified Rankin (Stroke Patients Only) ?  ? ? ?  ?Balance Overall balance assessment: Needs assistance ?Sitting-balance support: No upper extremity supported, Feet supported ?Sitting balance-Leahy Scale: Good ?  ?  ?Standing balance support: Bilateral upper extremity supported ?Standing balance-Leahy Scale: Fair ?  ?  ?  ?  ?  ?  ?  ?  ?  ?  ?  ?  ?  ? ?  ?Cognition Arousal/Alertness: Awake/alert ?Behavior During Therapy: [facility] for tasks assessed/performed ?Overall Cognitive Status: Within Functional Limits for tasks assessed ?  ?  ?  ?  ?  ?  ?  ?  ?  ?  ?  ?  ?  ?  ?  ?  ?  ?  ?  ? ?  ?Exercises Total Joint Exercises ?Ankle Circles/Pumps: AROM, Both, 15 reps, Supine ?Quad Sets: AROM, Both, 10 reps, Supine ?Heel Slides: AAROM, Left, 20 reps, Supine ?Hip ABduction/ADduction: AAROM, Left, 15 reps, Supine ? ?  ?General Comments   ?  ?  ? ?Pertinent Vitals/Pain Pain Assessment ?Pain Assessment: 0-10 ?Pain Score: 5  ?Pain Location: L hip ?Pain Descriptors / Indicators: Aching,  Sore ?Pain Intervention(s): Limited activity within patient's tolerance, Monitored during session, Premedicated before session, Ice applied  ? ? ?Home Living   ?  ?  ?  ?  ?  ?  ?  ?  ?  ?   ?  ?Prior Function    ?  ?  ?   ? ?PT Goals (current goals can now be found in the care plan section) Acute Rehab PT Goals ?Patient Stated Goal: Regain iND ?PT Goal Formulation: With patient ?Time For Goal Achievement: 10/27/21 ?Potential to Achieve Goals: Good ?Progress  towards PT goals: Progressing toward goals ? ?  ?Frequency ? ? ? 7X/week ? ? ? ?  ?PT Plan Current plan remains appropriate  ? ? ?Co-evaluation   ?  ?  ?  ?  ? ?  ?AM-PAC PT "6 Clicks" Mobility   ?Outcome Measure ? Help needed turning from your back to your side while in a flat bed without using bedrails?: A Little ?Help needed moving from lying on your back to sitting on the side of a flat bed without using bedrails?: A Little ?Help needed moving to and from a bed to a chair (including a wheelchair)?: A Little ?Help needed standing up from a chair using your arms (e.g., wheelchair or bedside chair)?: A Little ?Help needed to walk in hospital room?: A Little ?Help needed climbing 3-5 steps with a railing? : A Lot ?6 Click Score: 17 ? ?  ?End of Session Equipment Utilized During Treatment: Gait belt ?Activity Tolerance: Patient tolerated treatment well ?Patient left: in chair;with call bell/phone within reach;with family/visitor present ?Nurse Communication: Mobility status ?PT Visit Diagnosis: Unsteadiness on feet (R26.81);Difficulty in walking, not elsewhere classified (R26.2) ?  ? ? ?Time: 7342-8768 ?PT Time Calculation (min) (ACUTE ONLY): 26 min ? ?Charges:  $Gait Training: 8-22 mins ?$Therapeutic Activity: 8-22 mins          ?          ? ?Mauro Kaufmann PT ?Acute Rehabilitation Services ?Pager [phone] ?Office [phone] ? ? ? ?Demarques Pilz ?10/21/2021, 4:10 PM ? ?

## 2021-10-21 NOTE — TOC Transition Note (Signed)
Transition of Care (TOC) - CM/SW Discharge Note ? ? ?Patient Details  ?Name: Bridget Mccarthy ?MRN: [id number] ?Date of Birth: [birth year] ? ?Transition of Care (TOC) CM/SW Contact:  ?Darleene Cleaver, LCSW ?Phone Number: ?10/21/2021, 11:03 AM ? ? ?Clinical Narrative:    ? ?Patient will be going home with home health PT through [facility].  CSW signing off please reconsult with any other social work needs, home health agency has been notified of planned discharge.  CSW spoke to patient, she stated she needs a rolling walker.  CSW spoke to [place] at [facility], they will deliver to room  before patient discharges. ? ? ? ?Final next level of care: Home w Home Health Services ?Barriers to Discharge: Barriers Resolved ? ? ?Patient Goals and CMS Choice ?Patient states their goals for this hospitalization and ongoing recovery are:: To return back home with home health. ?CMS Medicare.gov Compare Post Acute Care list provided to:: Patient ?Choice offered to / list presented to : Patient ? ?Discharge Placement ?  ?           ?  ?  ?  ?  ? ?Discharge Plan and Services ?  ?  ?           ?DME Arranged: Walker rolling ?DME Agency: [facility] ?Date DME Agency Contacted: 10/21/21 ?Time DME Agency Contacted: 1102 ?Representative spoke with at DME Agency: Leavy Cella ?HH Arranged: PT ?HH Agency: [facility] ?Date HH Agency Contacted: 10/16/21 ?Time HH Agency Contacted: 1103 ?Representative spoke with at [facility] Agency: Stacie ? ?Social Determinants of Health (SDOH) Interventions ?  ? ? ?Readmission Risk Interventions ?No flowsheet data found. ? ? ? ? ?

## 2021-10-21 NOTE — Progress Notes (Signed)
Physical Therapy Treatment ?Patient Details ?Name: Bridget Mccarthy ?MRN: [id number] ?DOB: 17-Mar-1956 ?Today's Date: 10/21/2021 ? ? ?History of Present Illness Pt s/p L THR ? ?  ?PT Comments  ? ? Pt continues very motivated and performed therex program with assist but OOB activity limited by orthostatic hypotension. BP supine 128/71; sit 129/72, stand 108/58; stand 1 minute 94/55.  ?Recommendations for follow up therapy are one component of a multi-disciplinary discharge planning process, led by the attending physician.  Recommendations may be updated based on patient status, additional functional criteria and insurance authorization. ? ?Follow Up Recommendations ? Follow physician's recommendations for discharge plan and follow up therapies ?  ?  ?Assistance Recommended at Discharge Intermittent Supervision/Assistance  ?Patient can return home with the following A little help with walking and/or transfers;A little help with bathing/dressing/bathroom;Assistance with cooking/housework;Assist for transportation;Help with stairs or ramp for entrance ?  ?Equipment Recommendations ? None recommended by PT  ?  ?Recommendations for Other Services   ? ? ?  ?Precautions / Restrictions Precautions ?Precautions: Fall ?Restrictions ?Weight Bearing Restrictions: No ?LLE Weight Bearing: Weight bearing as tolerated  ?  ? ?Mobility ? Bed Mobility ?Overal bed mobility: Needs Assistance ?Bed Mobility: Supine to Sit ?  ?  ?Supine to sit: Min assist ?  ?  ?General bed mobility comments: cues for sequence and use of R LE to self assist ?  ? ?Transfers ?Overall transfer level: Needs assistance ?Equipment used: Rolling walker (2 wheels) ?Transfers: Sit to/from Stand, Bed to chair/wheelchair/BSC ?Sit to Stand: Min assist ?  ?Step pivot transfers: Min assist ?  ?  ?  ?General transfer comment: cues for LE management and use of UEs to self assist ?  ? ?Ambulation/Gait ?Ambulation/Gait assistance: Min assist ?Gait Distance (Feet): 3  Feet ?Assistive device: Rolling walker (2 wheels) ?Gait Pattern/deviations: Step-to pattern, Decreased step length - right, Decreased step length - left, Shuffle, Trunk flexed ?Gait velocity: decr ?  ?  ?General Gait Details: cues for sequence and position from RW ? ? ?Stairs ?  ?  ?  ?  ?  ? ? ?Wheelchair Mobility ?  ? ?Modified Rankin (Stroke Patients Only) ?  ? ? ?  ?Balance Overall balance assessment: Needs assistance ?Sitting-balance support: No upper extremity supported, Feet supported ?Sitting balance-Leahy Scale: Good ?  ?  ?Standing balance support: Bilateral upper extremity supported ?Standing balance-Leahy Scale: Poor ?  ?  ?  ?  ?  ?  ?  ?  ?  ?  ?  ?  ?  ? ?  ?Cognition Arousal/Alertness: Awake/alert ?Behavior During Therapy: [facility] for tasks assessed/performed ?Overall Cognitive Status: Within Functional Limits for tasks assessed ?  ?  ?  ?  ?  ?  ?  ?  ?  ?  ?  ?  ?  ?  ?  ?  ?  ?  ?  ? ?  ?Exercises Total Joint Exercises ?Ankle Circles/Pumps: AROM, Both, 15 reps, Supine ?Quad Sets: AROM, Both, 10 reps, Supine ?Heel Slides: AAROM, Left, 20 reps, Supine ?Hip ABduction/ADduction: AAROM, Left, 15 reps, Supine ? ?  ?General Comments   ?  ?  ? ?Pertinent Vitals/Pain Pain Assessment ?Pain Assessment: 0-10 ?Pain Score: 5  ?Pain Location: L hip ?Pain Descriptors / Indicators: Aching, Sore ?Pain Intervention(s): Limited activity within patient's tolerance, Monitored during session, Premedicated before session, Ice applied  ? ? ?Home Living   ?  ?  ?  ?  ?  ?  ?  ?  ?  ?   ?  ?  Prior Function    ?  ?  ?   ? ?PT Goals (current goals can now be found in the care plan section) Acute Rehab PT Goals ?Patient Stated Goal: Regain iND ?PT Goal Formulation: With patient ?Time For Goal Achievement: 10/27/21 ?Potential to Achieve Goals: Good ?Progress towards PT goals: Progressing toward goals ? ?  ?Frequency ? ? ? 7X/week ? ? ? ?  ?PT Plan Current plan remains appropriate  ? ? ?Co-evaluation   ?  ?  ?  ?  ? ?  ?AM-PAC PT "6  Clicks" Mobility   ?Outcome Measure ? Help needed turning from your back to your side while in a flat bed without using bedrails?: A Little ?Help needed moving from lying on your back to sitting on the side of a flat bed without using bedrails?: A Little ?Help needed moving to and from a bed to a chair (including a wheelchair)?: A Little ?Help needed standing up from a chair using your arms (e.g., wheelchair or bedside chair)?: A Little ?Help needed to walk in hospital room?: Total ?Help needed climbing 3-5 steps with a railing? : Total ?6 Click Score: 14 ? ?  ?End of Session Equipment Utilized During Treatment: Gait belt ?Activity Tolerance: Other (comment) (orthostatic) ?Patient left: in chair;with call bell/phone within reach ?Nurse Communication: Mobility status ?PT Visit Diagnosis: Unsteadiness on feet (R26.81);Difficulty in walking, not elsewhere classified (R26.2) ?  ? ? ?Time: 3354-5625 ?PT Time Calculation (min) (ACUTE ONLY): 32 min ? ?Charges:  $Therapeutic Exercise: 8-22 mins ?$Therapeutic Activity: 8-22 mins          ?          ? ?Mauro Kaufmann PT ?Acute Rehabilitation Services ?Pager [phone] ?Office [phone] ? ? ? ?Bridget Mccarthy ?10/21/2021, 11:56 AM ? ?

## 2021-10-21 NOTE — Progress Notes (Signed)
Subjective: ?1 Day Post-Op Procedure(s) (LRB): ?Left TOTAL HIP ARTHROPLASTY ANTERIOR APPROACH (Left) ?Patient reports pain as moderate.  She was lightheaded and orthostatic when attempting therapy this morning.  Right now her vital signs are stable.  Her hemoglobin is 11.2 which is stable.  Her husband is at the bedside.  He is concerned about taking her home today due to her orthostatic blood pressure when standing.  I agree with needing to likely keep her here another day. ? ?Objective: ?Vital signs in last 24 hours: ?Temp:  [97.9 ?F (36.6 ?C)-98.1 ?F (36.7 ?C)] 98.1 ?F (36.7 ?C) (03/04 5784) ?Pulse Rate:  [90-103] 96 (03/04 1016) ?Resp:  [14-18] 18 (03/04 1016) ?BP: (118-134)/(67-76) 118/71 (03/04 1016) ?SpO2:  [98 %-100 %] 98 % (03/04 1016) ? ?Intake/Output from previous day: ?03/03 0701 - 03/04 0700 ?In: 4063.8 [P.O.:1205; I.V.:2708.8; IV Piggyback:150] ?Out: 3150 [Urine:2950; Blood:200] ?Intake/Output this shift: ?No intake/output data recorded. ? ?Recent Labs  ?  10/21/21 ?0307  ?HGB 11.2*  ? ?Recent Labs  ?  10/21/21 ?0307  ?WBC 10.9*  ?RBC 3.39*  ?HCT 34.5*  ?PLT 290  ? ?Recent Labs  ?  10/21/21 ?0307  ?NA 133*  ?K 3.8  ?CL 96*  ?CO2 30  ?BUN 13  ?CREATININE 0.54  ?GLUCOSE 135*  ?CALCIUM 8.5*  ? ?No results for input(s): LABPT, INR in the last 72 hours. ? ?Sensation intact distally ?Intact pulses distally ?Dorsiflexion/Plantar flexion intact ?Incision: dressing C/D/I ? ? ?Assessment/Plan: ?1 Day Post-Op Procedure(s) (LRB): ?Left TOTAL HIP ARTHROPLASTY ANTERIOR APPROACH (Left) ?Up with therapy ?Plan for discharge tomorrow ?Discharge home with home health ? ? ? ? ? ?Kathryne Hitch ?10/21/2021, 10:54 AM ? ?

## 2021-10-22 DIAGNOSIS — M1612 Unilateral primary osteoarthritis, left hip: Secondary | ICD-10-CM | POA: Diagnosis not present

## 2021-10-22 LAB — CBC
HCT: 31.7 % — ABNORMAL LOW (ref 36.0–46.0)
Hemoglobin: 10.3 g/dL — ABNORMAL LOW (ref 12.0–15.0)
MCH: 33.4 pg (ref 26.0–34.0)
MCHC: 32.5 g/dL (ref 30.0–36.0)
MCV: 102.9 fL — ABNORMAL HIGH (ref 80.0–100.0)
Platelets: 252 10*3/uL (ref 150–400)
RBC: 3.08 MIL/uL — ABNORMAL LOW (ref 3.87–5.11)
RDW: 13.4 % (ref 11.5–15.5)
WBC: 9.7 10*3/uL (ref 4.0–10.5)
nRBC: 0 % (ref 0.0–0.2)

## 2021-10-22 NOTE — Progress Notes (Signed)
?   10/22/21 1505  ?Orthostatic Lying   ?BP- Lying 102/63  ?Orthostatic Sitting  ?BP- Sitting 90/67  ?Orthostatic Standing at 0 minutes  ?BP- Standing at 0 minutes 91/61  ? ?Pt walking BP was 93/61 ?Pt stairs BP 92/64 ?Max HR 132 ?No dizziness, asymptomatic.   ?

## 2021-10-22 NOTE — Progress Notes (Signed)
Physical Therapy Treatment ?Patient Details ?Name: Bridget Mccarthy ?MRN: [id number] ?DOB: Jan 09, 1956 ?Today's Date: 10/22/2021 ? ? ?History of Present Illness Pt s/p L THR ? ?  ?PT Comments  ? ? Pt continues very cooperative and progressing with mobility.  Requiring decreased assist for all tasks and with activity tolerance improving but continues limited by orthostatic hypotension - BP supine 116/69; sitting 110/70; standing 104/60; after trip to/from bathroom 97/63; after ambulating 130' 71/61.  Pt denying all symptoms until she had ambulated 130'.  Rn aware.  ?Recommendations for follow up therapy are one component of a multi-disciplinary discharge planning process, led by the attending physician.  Recommendations may be updated based on patient status, additional functional criteria and insurance authorization. ? ?Follow Up Recommendations ? Follow physician's recommendations for discharge plan and follow up therapies ?  ?  ?Assistance Recommended at Discharge Intermittent Supervision/Assistance  ?Patient can return home with the following A little help with walking and/or transfers;A little help with bathing/dressing/bathroom;Assistance with cooking/housework;Assist for transportation;Help with stairs or ramp for entrance ?  ?Equipment Recommendations ? None recommended by PT  ?  ?Recommendations for Other Services   ? ? ?  ?Precautions / Restrictions Precautions ?Precautions: Fall ?Restrictions ?Weight Bearing Restrictions: No ?LLE Weight Bearing: Weight bearing as tolerated  ?  ? ?Mobility ? Bed Mobility ?Overal bed mobility: Needs Assistance ?Bed Mobility: Supine to Sit ?  ?  ?Supine to sit: Min guard ?  ?  ?General bed mobility comments: cues for use of R LE to self assist ?  ? ?Transfers ?Overall transfer level: Needs assistance ?Equipment used: Rolling walker (2 wheels) ?Transfers: Sit to/from Stand ?Sit to Stand: Min guard, Supervision ?  ?  ?  ?  ?  ?General transfer comment: pt self-cues for LE  management and use of UEs to self assist; up from EOB, to/from [facility] and to recliner ?  ? ?Ambulation/Gait ?Ambulation/Gait assistance: Min guard ?Gait Distance (Feet): 130 Feet ?Assistive device: Rolling walker (2 wheels) ?Gait Pattern/deviations: Step-to pattern, Step-through pattern, Decreased step length - right, Decreased step length - left, Shuffle, Trunk flexed ?Gait velocity: decr ?  ?  ?General Gait Details: min cues for posture, position from RW and initial sequence ? ? ?Stairs ?  ?  ?  ?  ?  ? ? ?Wheelchair Mobility ?  ? ?Modified Rankin (Stroke Patients Only) ?  ? ? ?  ?Balance Overall balance assessment: Needs assistance ?Sitting-balance support: No upper extremity supported, Feet supported ?Sitting balance-Leahy Scale: Good ?  ?  ?Standing balance support: No upper extremity supported ?Standing balance-Leahy Scale: Fair ?Standing balance comment: at sink for hygiene ?  ?  ?  ?  ?  ?  ?  ?  ?  ?  ?  ?  ? ?  ?Cognition Arousal/Alertness: Awake/alert ?Behavior During Therapy: [facility] for tasks assessed/performed ?Overall Cognitive Status: Within Functional Limits for tasks assessed ?  ?  ?  ?  ?  ?  ?  ?  ?  ?  ?  ?  ?  ?  ?  ?  ?  ?  ?  ? ?  ?Exercises   ? ?  ?General Comments   ?  ?  ? ?Pertinent Vitals/Pain Pain Assessment ?Pain Assessment: 0-10 ?Pain Score: 4  ?Pain Location: L hip ?Pain Descriptors / Indicators: Aching, Sore ?Pain Intervention(s): Limited activity within patient's tolerance, Monitored during session, Premedicated before session, Ice applied  ? ? ?Home Living   ?  ?  ?  ?  ?  ?  ?  ?  ?  ?   ?  ?  Prior Function    ?  ?  ?   ? ?PT Goals (current goals can now be found in the care plan section) Acute Rehab PT Goals ?Patient Stated Goal: Regain iND ?PT Goal Formulation: With patient ?Time For Goal Achievement: 10/27/21 ?Potential to Achieve Goals: Good ?Progress towards PT goals: Progressing toward goals ? ?  ?Frequency ? ? ? 7X/week ? ? ? ?  ?PT Plan Current plan remains appropriate   ? ? ?Co-evaluation   ?  ?  ?  ?  ? ?  ?AM-PAC PT "6 Clicks" Mobility   ?Outcome Measure ? Help needed turning from your back to your side while in a flat bed without using bedrails?: A Little ?Help needed moving from lying on your back to sitting on the side of a flat bed without using bedrails?: A Little ?Help needed moving to and from a bed to a chair (including a wheelchair)?: A Little ?Help needed standing up from a chair using your arms (e.g., wheelchair or bedside chair)?: A Little ?Help needed to walk in hospital room?: A Little ?Help needed climbing 3-5 steps with a railing? : A Lot ?6 Click Score: 17 ? ?  ?End of Session Equipment Utilized During Treatment: Gait belt ?Activity Tolerance: Other (comment) (orthostatic and symptomatic) ?Patient left: in chair;with call bell/phone within reach ?Nurse Communication: Mobility status ?PT Visit Diagnosis: Unsteadiness on feet (R26.81);Difficulty in walking, not elsewhere classified (R26.2) ?  ? ? ?Time: 7341-9379 ?PT Time Calculation (min) (ACUTE ONLY): 24 min ? ?Charges:  $Gait Training: 8-22 mins ?$Therapeutic Activity: 8-22 mins          ?          ? ?Mauro Kaufmann PT ?Acute Rehabilitation Services ?Pager [phone] ?Office [phone] ? ? ? ?Tyge Somers ?10/22/2021, 12:09 PM ? ?

## 2021-10-22 NOTE — Progress Notes (Addendum)
Physical Therapy Treatment ?Patient Details ?Name: Bridget Mccarthy ?MRN: [id number] ?DOB: [birth year] ?Today's Date: 10/22/2021 ? ? ?History of Present Illness Pt s/p L THR ? ?  ?PT Comments  ? ? Pt progressing well with mobility tasks and with no c/o dizziness throughout session.  Pt up to ambulate in hall, negotiated stairs and reviewed car transfers with spouse present.  BP sitting 102/63; sitting 90/67; standing 91/61;  after walking 93/61.  Heart rate max at 132.  RN aware.  Pt hopeful for dc home this date  ?Recommendations for follow up therapy are one component of a multi-disciplinary discharge planning process, led by the attending physician.  Recommendations may be updated based on patient status, additional functional criteria and insurance authorization. ? ?Follow Up Recommendations ? Follow physician's recommendations for discharge plan and follow up therapies ?  ?  ?Assistance Recommended at Discharge Intermittent Supervision/Assistance  ?Patient can return home with the following A little help with walking and/or transfers;A little help with bathing/dressing/bathroom;Assistance with cooking/housework;Assist for transportation;Help with stairs or ramp for entrance ?  ?Equipment Recommendations ? None recommended by PT  ?  ?Recommendations for Other Services   ? ? ?  ?Precautions / Restrictions Precautions ?Precautions: Fall ?Restrictions ?Weight Bearing Restrictions: No ?LLE Weight Bearing: Weight bearing as tolerated  ?  ? ?Mobility ? Bed Mobility ?Overal bed mobility: Needs Assistance ?Bed Mobility: Supine to Sit ?  ?  ?Supine to sit: Min guard ?  ?  ?General bed mobility comments: Pt up in chair and requests back to same ?  ? ?Transfers ?Overall transfer level: Needs assistance ?Equipment used: Rolling walker (2 wheels) ?Transfers: Sit to/from Stand ?Sit to Stand: Supervision ?  ?  ?  ?  ?  ?General transfer comment: pt self-cues for LE management and use of UEs to self assist; up from EOB, to/from  [facility] and to recliner ?  ? ?Ambulation/Gait ?Ambulation/Gait assistance: Min guard, Supervision ?Gait Distance (Feet): 140 Feet ?Assistive device: Rolling walker (2 wheels) ?Gait Pattern/deviations: Step-to pattern, Step-through pattern, Decreased step length - right, Decreased step length - left, Shuffle, Trunk flexed ?Gait velocity: decr ?  ?  ?General Gait Details: min cues for posture, position from RW and initial sequence ? ? ?Stairs ?Stairs: Yes ?Stairs assistance: Min assist ?Stair Management: No rails, Step to pattern, Backwards, With walker ?Number of Stairs: 4 ?General stair comments: 2 steps twice bkwd with RW; cues for sequence and foot/RW placement; spouse assisting on 2nd attempt ? ? ?Wheelchair Mobility ?  ? ?Modified Rankin (Stroke Patients Only) ?  ? ? ?  ?Balance Overall balance assessment: Needs assistance ?Sitting-balance support: No upper extremity supported, Feet supported ?Sitting balance-Leahy Scale: Good ?  ?  ?Standing balance support: No upper extremity supported ?Standing balance-Leahy Scale: Fair ?Standing balance comment: at sink for hygiene ?  ?  ?  ?  ?  ?  ?  ?  ?  ?  ?  ?  ? ?  ?Cognition Arousal/Alertness: Awake/alert ?Behavior During Therapy: [facility] for tasks assessed/performed ?Overall Cognitive Status: Within Functional Limits for tasks assessed ?  ?  ?  ?  ?  ?  ?  ?  ?  ?  ?  ?  ?  ?  ?  ?  ?  ?  ?  ? ?  ?Exercises   ? ?  ?General Comments   ?  ?  ? ?Pertinent Vitals/Pain Pain Assessment ?Pain Assessment: 0-10 ?Pain Score: 3  ?Pain Location: L hip ?Pain  Descriptors / Indicators: Aching, Sore ?Pain Intervention(s): Limited activity within patient's tolerance, Monitored during session, Premedicated before session  ? ? ?Home Living   ?  ?  ?  ?  ?  ?  ?  ?  ?  ?   ?  ?Prior Function    ?  ?  ?   ? ?PT Goals (current goals can now be found in the care plan section) Acute Rehab PT Goals ?Patient Stated Goal: Regain iND ?PT Goal Formulation: With patient ?Time For Goal Achievement:  10/27/21 ?Potential to Achieve Goals: Good ?Progress towards PT goals: Progressing toward goals ? ?  ?Frequency ? ? ? 7X/week ? ? ? ?  ?PT Plan Current plan remains appropriate  ? ? ?Co-evaluation   ?  ?  ?  ?  ? ?  ?AM-PAC PT "6 Clicks" Mobility   ?Outcome Measure ? Help needed turning from your back to your side while in a flat bed without using bedrails?: A Little ?Help needed moving from lying on your back to sitting on the side of a flat bed without using bedrails?: A Little ?Help needed moving to and from a bed to a chair (including a wheelchair)?: A Little ?Help needed standing up from a chair using your arms (e.g., wheelchair or bedside chair)?: A Little ?Help needed to walk in hospital room?: A Little ?Help needed climbing 3-5 steps with a railing? : A Little ?6 Click Score: 18 ? ?  ?End of Session Equipment Utilized During Treatment: Gait belt ?Activity Tolerance: Patient tolerated treatment well ?Patient left: in chair;with call bell/phone within reach ?Nurse Communication: Mobility status ?PT Visit Diagnosis: Unsteadiness on feet (R26.81);Difficulty in walking, not elsewhere classified (R26.2) ?  ? ? ?Time: 1430-1500 ?PT Time Calculation (min) (ACUTE ONLY): 30 min ? ?Charges:  $Gait Training: 8-22 mins ?$Therapeutic Activity: 8-22 mins          ?          ? ?[name] PT ?Acute Rehabilitation Services ?Pager [phone] ?Office [phone] ? ? ? ?Zaeden Lastinger ?10/22/2021, 4:01 PM ? ?

## 2021-10-22 NOTE — Progress Notes (Signed)
?  Subjective: ?Bridget Mccarthy is a 66 y.o. female s/p left THA.  They are POD 2.  Pt's pain is controlled.  Pt has ambulated with some difficulty.  Overall she feels very well.  She did struggle with symptomatic hypotension with her last therapy session where she walked about 130 feet before becoming symptomatic and feel like she wanted to pass out.  Had to be wheeled back to her room.  This is improved as far as the distance she was able to ambulate before becoming symptomatic compared with yesterday.  No dizziness or lightheadedness at rest.  No complaint of chest pain or shortness of breath.  No abdominal pain.  No calf pain. ? ?Objective: ?Vital signs in last 24 hours: ?Temp:  [98.4 ?F (36.9 ?C)-98.8 ?F (37.1 ?C)] 98.8 ?F (37.1 ?C) (03/05 0600) ?Pulse Rate:  [100-105] 105 (03/05 0856) ?Resp:  [16] 16 (03/05 0600) ?BP: (116-130)/(68-76) 116/69 (03/05 0856) ?SpO2:  [96 %-98 %] 97 % (03/05 0600) ? ?Intake/Output from previous day: ?03/04 0701 - 03/05 0700 ?In: 2415 [P.O.:840; I.V.:1575] ?Out: 1800 [Urine:1800] ?Intake/Output this shift: ?Total I/O ?In: 600 [P.O.:600] ?Out: 750 [Urine:750] ? ?Exam: ? ?No gross blood or drainage overlying the dressing ?2+ PT pulse ?Sensation intact distally in the left foot ?Able to dorsiflex and plantarflex the left foot ?No calf tenderness.  Negative Homans' sign. ? ? ?Labs: ?Recent Labs  ?  10/21/21 ?0307  ?HGB 11.2*  ? ?Recent Labs  ?  10/21/21 ?0307  ?WBC 10.9*  ?RBC 3.39*  ?HCT 34.5*  ?PLT 290  ? ?Recent Labs  ?  10/21/21 ?0307  ?NA 133*  ?K 3.8  ?CL 96*  ?CO2 30  ?BUN 13  ?CREATININE 0.54  ?GLUCOSE 135*  ?CALCIUM 8.5*  ? ?No results for input(s): LABPT, INR in the last 72 hours. ? ?Assessment/Plan: ?Pt is POD 2 s/p left THA.   ? -Plan to discharge to home today or tomorrow pending patient's pain and PT eval ? -WBAT with a walker ? -Plan for 1 more physical therapy session this afternoon for evaluation of whether or not she can go home.  If she does not become  symptomatic and feel lightheadedness, dizziness, feel like she wants pass out then she may be discharged home.  If there is any symptomatic hypotension, plan to hold her until tomorrow.  Also plan to check CBC which was not checked today just to ensure that her hemoglobin has not continued to decline. ?  ? ? ?Joycie Peek Delphin Funes ?10/22/2021, 12:43 PM  ? ? ?   ?

## 2021-10-22 NOTE — Progress Notes (Signed)
PT AVS reviewed and pt verbalized understanding of all teaching. Pt IV removed without complications. PT has all pt belongings in pt possesion.  ?

## 2021-10-23 ENCOUNTER — Encounter (HOSPITAL_COMMUNITY): Payer: Self-pay | Admitting: Orthopaedic Surgery

## 2021-10-29 ENCOUNTER — Other Ambulatory Visit: Payer: Self-pay | Admitting: Orthopaedic Surgery

## 2021-10-29 NOTE — Discharge Summary (Cosign Needed)
Physician Discharge Summary      Patient ID: Bridget Mccarthy MRN: [id number] DOB/AGE: 1955/08/26 66 y.o.  Admit date: 10/20/2021 Discharge date: 10/22/2021  Admission Diagnoses:  Principal Problem:   Unilateral primary osteoarthritis, left hip Active Problems:   Status post left hip replacement   Discharge Diagnoses:  Same  Surgeries: Procedure(s): Left TOTAL HIP ARTHROPLASTY ANTERIOR APPROACH on 10/20/2021   Consultants:   Discharged Condition: Stable  Hospital Course: Bridget Mccarthy is an 66 y.o. female who was admitted 10/20/2021 with a chief complaint of left hip pain, and found to have a diagnosis of left hip osteoarthritis.  They were brought to the operating room on 10/20/2021 and underwent the above named procedures.  Pt awoke from anesthesia without complication and was transferred to the floor. On POD1, patient's pain was overall controlled but she struggled with mobility due to hypotension that was symptomatic with feel like she was going to pass out.  This improved on POD 2 with her able to ambulate about 130 feet before becoming symptomatic.  She subsequently had another therapy session that day where she was able to ambulate a long distance without any sort of symptoms such as dizziness or lightheadedness.  She denied any chest pain or shortness of breath during her stay.  She was discharged home on POD 2..  Pt will f/u with Dr. Magnus Ivan in clinic in ~2 weeks.   Antibiotics given:  Anti-infectives (From admission, onward)    Start     Dose/Rate Route Frequency Ordered Stop   10/20/21 1200  ceFAZolin (ANCEF) IVPB 1 g/50 mL premix        1 g 100 mL/hr over 30 Minutes Intravenous Every 6 hours 10/20/21 0953 10/20/21 1811   10/20/21 0600  ceFAZolin (ANCEF) IVPB 2g/100 mL premix        2 g 200 mL/hr over 30 Minutes Intravenous On call to O.R. 10/20/21 0272 10/20/21 5366     .  Recent vital signs:  Vitals:   10/22/21 0600 10/22/21 0856  BP: 124/68 116/69   Pulse: (!) 102 (!) 105  Resp: 16   Temp: 98.8 F (37.1 C)   SpO2: 97%     Recent laboratory studies:  Results for orders placed or performed during the hospital encounter of 10/20/21  CBC  Result Value Ref Range   WBC 10.9 (H) 4.0 - 10.5 K/uL   RBC 3.39 (L) 3.87 - 5.11 MIL/uL   Hemoglobin 11.2 (L) 12.0 - 15.0 g/dL   HCT 44.0 (L) 34.7 - 42.5 %   MCV 101.8 (H) 80.0 - 100.0 fL   MCH 33.0 26.0 - 34.0 pg   MCHC 32.5 30.0 - 36.0 g/dL   RDW 95.6 38.7 - 56.4 %   Platelets 290 150 - 400 K/uL   nRBC 0.0 0.0 - 0.2 %  Basic metabolic panel  Result Value Ref Range   Sodium 133 (L) 135 - 145 mmol/L   Potassium 3.8 3.5 - 5.1 mmol/L   Chloride 96 (L) 98 - 111 mmol/L   CO2 30 22 - 32 mmol/L   Glucose, Bld 135 (H) 70 - 99 mg/dL   BUN 13 8 - 23 mg/dL   Creatinine, Ser 3.32 0.44 - 1.00 mg/dL   Calcium 8.5 (L) 8.9 - 10.3 mg/dL   GFR, Estimated >95 >18 mL/min   Anion gap 7 5 - 15  CBC  Result Value Ref Range   WBC 9.7 4.0 - 10.5 K/uL   RBC 3.08 (L) 3.87 -  5.11 MIL/uL   Hemoglobin 10.3 (L) 12.0 - 15.0 g/dL   HCT 16.1 (L) 09.6 - 04.5 %   MCV 102.9 (H) 80.0 - 100.0 fL   MCH 33.4 26.0 - 34.0 pg   MCHC 32.5 30.0 - 36.0 g/dL   RDW 40.9 81.1 - 91.4 %   Platelets 252 150 - 400 K/uL   nRBC 0.0 0.0 - 0.2 %  ABO/Rh  Result Value Ref Range   ABO/RH(D)      A POS Performed at [facility], [address], [place], [place] [address]     Discharge Medications:   Allergies as of 10/22/2021       Reactions   Shrimp [shellfish Allergy] Anaphylaxis   Throat swelling- likely anaphylaxis. Didn't have to take benadryl or otherthough and resolved        Medication List     TAKE these medications    acetaminophen 500 MG tablet Commonly known as: TYLENOL Take 500-1,000 mg by mouth every 6 (six) hours as needed for moderate pain.   aspirin 81 MG chewable tablet Chew 1 tablet (81 mg total) by mouth 2 (two) times daily.   celecoxib 100 MG capsule Commonly known as:  CELEBREX Take 100 mg by mouth 2 (two) times daily as needed for pain.   methocarbamol 500 MG tablet Commonly known as: ROBAXIN Take 1 tablet (500 mg total) by mouth every 6 (six) hours as needed for muscle spasms.   multivitamin tablet Take 1 tablet by mouth daily.   oxyCODONE 5 MG immediate release tablet Commonly known as: Oxy IR/ROXICODONE Take 1-2 tablets (5-10 mg total) by mouth every 6 (six) hours as needed for moderate pain (pain score 4-6).        Diagnostic Studies: DG Pelvis Portable  Result Date: 10/20/2021 CLINICAL DATA:  Left anterior hip replacement. EXAM: PORTABLE PELVIS 1-2 VIEWS COMPARISON:  Left hip radiographs 06/09/2021 FINDINGS: Left total hip arthroplasty present. Fluid gas are present in the joint. Skin staples are in place. The hip appears located on this single view. Foley catheter is in place. IMPRESSION: Left total hip arthroplasty without radiographic evidence for complication. Electronically Signed   By: Marin Roberts M.D.   On: 10/20/2021 10:44   DG HIP UNILAT WITH PELVIS 1V LEFT  Result Date: 10/20/2021 CLINICAL DATA:  LEFT hip arthroplasty EXAM: DG HIP (WITH OR WITHOUT PELVIS) 1V*L* COMPARISON:  Intraoperative C-arm images compared to 06/09/2021 FLUOROSCOPY TIME: 0 minutes 13 seconds Dose: 1.17 mGy Images: 1 FINDINGS: Osseous demineralization. LEFT hip prosthesis without fracture or dislocation. Degenerative changes of the RIGHT hip joint also noted. IMPRESSION: LEFT hip arthroplasty without acute complication. Electronically Signed   By: Ulyses Southward M.D.   On: 10/20/2021 08:36    Disposition: Discharge disposition: 01-Home or Self Care       Discharge Instructions     Call MD / Call 911   Complete by: As directed    If you experience chest pain or shortness of breath, CALL 911 and be transported to the hospital emergency room.  If you develope a fever above 101 F, pus (white drainage) or increased drainage or redness at the wound, or calf  pain, call your surgeon's office.   Constipation Prevention   Complete by: As directed    Drink plenty of fluids.  Prune juice may be helpful.  You may use a stool softener, such as Colace (over the counter) 100 mg twice a day.  Use MiraLax (over the counter) for constipation  as needed.   Diet - low sodium heart healthy   Complete by: As directed    Discharge instructions   Complete by: As directed    You may shower, dressing is waterproof.  Do not remove the dressing, we will remove it at your first post-op appointment.  Do not take a bath or soak the knee in a tub or pool.  You may weightbear as you can tolerate on the operative leg with a walker.   You will follow-up with Dr. Magnus IvanBlackman in the clinic in 2 weeks at your given appointment date.  Call the office with any questions or concerns at [phone]. Contact us quickly if you notice worsening dizziness such as you experienced in the hospital.    INSTRUCTIONS AFTER JOINT REPLACEMENT   Remove items at home which could result in a fall. This includes throw rugs or furniture in walking pathways ICE to the affected joint every three hours while awake for 30 minutes at a time, for at least the first 3-5 days, and then as needed for pain and swelling.  Continue to use ice for pain and swelling. You may notice swelling that will progress down to the foot and ankle.  This is normal after surgery.  Elevate your leg when you are not up walking on it.   Continue to use the breathing machine you got in the hospital (incentive spirometer) which will help keep your temperature down.  It is common for your temperature to cycle up and down following surgery, especially at night when you are not up moving around and exerting yourself.  The breathing machine keeps your lungs expanded and your temperature down.   DIET:  As you were doing prior to hospitalization, we recommend a well-balanced diet.  DRESSING / WOUND CARE / SHOWERING  Keep the surgical  dressing until follow up.  The dressing is water proof, so you can shower without any extra covering.  IF THE DRESSING FALLS OFF or the wound gets wet inside, change the dressing with sterile gauze.  Please use good hand washing techniques before changing the dressing.  Do not use any lotions or creams on the incision until instructed by your surgeon.    ACTIVITY  Increase activity slowly as tolerated, but follow the weight bearing instructions below.   No driving for 6 weeks or until further direction given by your physician.  You cannot drive while taking narcotics.  No lifting or carrying greater than 10 lbs. until further directed by your surgeon. Avoid periods of inactivity such as sitting longer than an hour when not asleep. This helps prevent blood clots.  You may return to work once you are authorized by your doctor.     WEIGHT BEARING   Weight bearing as tolerated with assist device (walker, cane, etc) as directed, use it as long as suggested by your surgeon or therapist, typically at least 4-6 weeks.   EXERCISES  Results after joint replacement surgery are often greatly improved when you follow the exercise, range of motion and muscle strengthening exercises prescribed by your doctor. Safety measures are also important to protect the joint from further injury. Any time any of these exercises cause you to have increased pain or swelling, decrease what you are doing until you are comfortable again and then slowly increase them. If you have problems or questions, call your caregiver or physical therapist for advice.   Rehabilitation is important following a joint replacement. After just a few days of immobilization, the  muscles of the leg can become weakened and shrink (atrophy).  These exercises are designed to build up the tone and strength of the thigh and leg muscles and to improve motion. Often times heat used for twenty to thirty minutes before working out will loosen up your  tissues and help with improving the range of motion but do not use heat for the first two weeks following surgery (sometimes heat can increase post-operative swelling).   These exercises can be done on a training (exercise) mat, on the floor, on a table or on a bed. Use whatever works the best and is most comfortable for you.    Use music or television while you are exercising so that the exercises are a pleasant break in your day. This will make your life better with the exercises acting as a break in your routine that you can look forward to.   Perform all exercises about fifteen times, three times per day or as directed.  You should exercise both the operative leg and the other leg as well.  Exercises include:   Quad Sets - Tighten up the muscle on the front of the thigh (Quad) and hold for 5-10 seconds.   Straight Leg Raises - With your knee straight (if you were given a brace, keep it on), lift the leg to 60 degrees, hold for 3 seconds, and slowly lower the leg.  Perform this exercise against resistance later as your leg gets stronger.  Leg Slides: Lying on your back, slowly slide your foot toward your buttocks, bending your knee up off the floor (only go as far as is comfortable). Then slowly slide your foot back down until your leg is flat on the floor again.  Angel Wings: Lying on your back spread your legs to the side as far apart as you can without causing discomfort.  Hamstring Strength:  Lying on your back, push your heel against the floor with your leg straight by tightening up the muscles of your buttocks.  Repeat, but this time bend your knee to a comfortable angle, and push your heel against the floor.  You may put a pillow under the heel to make it more comfortable if necessary.   A rehabilitation program following joint replacement surgery can speed recovery and prevent re-injury in the future due to weakened muscles. Contact your doctor or a physical therapist for more information on  knee rehabilitation.    CONSTIPATION  Constipation is defined medically as fewer than three stools per week and severe constipation as less than one stool per week.  Even if you have a regular bowel pattern at home, your normal regimen is likely to be disrupted due to multiple reasons following surgery.  Combination of anesthesia, postoperative narcotics, change in appetite and fluid intake all can affect your bowels.   YOU MUST use at least one of the following options; they are listed in order of increasing strength to get the job done.  They are all available over the counter, and you may need to use some, POSSIBLY even all of these options:    Drink plenty of fluids (prune juice may be helpful) and high fiber foods Colace 100 mg by mouth twice a day  Senokot for constipation as directed and as needed Dulcolax (bisacodyl), take with full glass of water  Miralax (polyethylene glycol) once or twice a day as needed.  If you have tried all these things and are unable to have a bowel movement in the first  3-4 days after surgery call either your surgeon or your primary doctor.    If you experience loose stools or diarrhea, hold the medications until you stool forms back up.  If your symptoms do not get better within 1 week or if they get worse, check with your doctor.  If you experience "the worst abdominal pain ever" or develop nausea or vomiting, please contact the office immediately for further recommendations for treatment.   ITCHING:  If you experience itching with your medications, try taking only a single pain pill, or even half a pain pill at a time.  You can also use Benadryl over the counter for itching or also to help with sleep.   TED HOSE STOCKINGS:  Use stockings on both legs until for at least 2 weeks or as directed by physician office. They may be removed at night for sleeping.  MEDICATIONS:  See your medication summary on the "After Visit Summary" that nursing will review with  you.  You may have some home medications which will be placed on hold until you complete the course of blood thinner medication.  It is important for you to complete the blood thinner medication as prescribed.  PRECAUTIONS:  If you experience chest pain or shortness of breath - call 911 immediately for transfer to the hospital emergency department.   If you develop a fever greater that 101 F, purulent drainage from wound, increased redness or drainage from wound, foul odor from the wound/dressing, or calf pain - CONTACT YOUR SURGEON.                                                   FOLLOW-UP APPOINTMENTS:  If you do not already have a post-op appointment, please call the office for an appointment to be seen by your surgeon.  Guidelines for how soon to be seen are listed in your "After Visit Summary", but are typically between 1-4 weeks after surgery.    POST-OPERATIVE OPIOID TAPER INSTRUCTIONS: It is important to wean off of your opioid medication as soon as possible. If you do not need pain medication after your surgery it is ok to stop day one. Opioids include: Codeine, Hydrocodone(Norco, Vicodin), Oxycodone(Percocet, oxycontin) and hydromorphone amongst others.  Long term and even short term use of opiods can cause: Increased pain response Dependence Constipation Depression Respiratory depression And more.  Withdrawal symptoms can include Flu like symptoms Nausea, vomiting And more Techniques to manage these symptoms Hydrate well Eat regular healthy meals Stay active Use relaxation techniques(deep breathing, meditating, yoga) Do Not substitute Alcohol to help with tapering If you have been on opioids for less than two weeks and do not have pain than it is ok to stop all together.  Plan to wean off of opioids This plan should start within one week post op of your joint replacement. Maintain the same interval or time between taking each dose and first decrease the dose.  Cut the  total daily intake of opioids by one tablet each day Next start to increase the time between doses. The last dose that should be eliminated is the evening dose.   MAKE SURE YOU:  Understand these instructions.  Get help right away if you are not doing well or get worse.    Thank you for letting us be a part of your medical care team.  It is a privilege we respect greatly.  We hope these instructions will help you stay on track for a fast and full recovery!    Dental Antibiotics:  In most cases prophylactic antibiotics for Dental procdeures after total joint surgery are not necessary.  Exceptions are as follows:  1. History of prior total joint infection  2. Severely immunocompromised (Organ Transplant, cancer chemotherapy, Rheumatoid biologic meds such as Humera)  3. Poorly controlled diabetes (A1C &gt; 8.0, blood glucose over 200)  If you have one of these conditions, contact your surgeon for an antibiotic prescription, prior to your dental procedure.   Increase activity slowly as tolerated   Complete by: As directed    Post-operative opioid taper instructions:   Complete by: As directed    POST-OPERATIVE OPIOID TAPER INSTRUCTIONS: It is important to wean off of your opioid medication as soon as possible. If you do not need pain medication after your surgery it is ok to stop day one. Opioids include: Codeine, Hydrocodone(Norco, Vicodin), Oxycodone(Percocet, oxycontin) and hydromorphone amongst others.  Long term and even short term use of opiods can cause: Increased pain response Dependence Constipation Depression Respiratory depression And more.  Withdrawal symptoms can include Flu like symptoms Nausea, vomiting And more Techniques to manage these symptoms Hydrate well Eat regular healthy meals Stay active Use relaxation techniques(deep breathing, meditating, yoga) Do Not substitute Alcohol to help with tapering If you have been on opioids for less than two weeks  and do not have pain than it is ok to stop all together.  Plan to wean off of opioids This plan should start within one week post op of your joint replacement. Maintain the same interval or time between taking each dose and first decrease the dose.  Cut the total daily intake of opioids by one tablet each day Next start to increase the time between doses. The last dose that should be eliminated is the evening dose.           Follow-up Information     Kathryne Hitch, MD Follow up in 2 week(s).   Specialty: Orthopedic Surgery Contact information: [address] [place] [address] [phone]                  Signed: Julieanne Cotton 10/29/2021, 6:01 PM

## 2021-11-02 ENCOUNTER — Encounter: Payer: Self-pay | Admitting: Physician Assistant

## 2021-11-02 ENCOUNTER — Ambulatory Visit (INDEPENDENT_AMBULATORY_CARE_PROVIDER_SITE_OTHER): Payer: BC Managed Care – PPO | Admitting: Physician Assistant

## 2021-11-02 DIAGNOSIS — Z96642 Presence of left artificial hip joint: Secondary | ICD-10-CM

## 2021-11-02 NOTE — Progress Notes (Signed)
:   Bridget Mccarthy returns today for follow-up of her left total hip arthroplasty.  She is overall doing well states she has 0.5 out of 10 pain.  She had no shortness of breath fevers or chills.  She is on aspirin 81 mg twice daily.  She is on no aspirin prior to surgery.  She only takes Tylenol at night.  Has no concerns. ? ? ?Physical exam: Left hip good range of motion without pain.  Ambulates without any assistive device.  Surgical incisions healing well no signs of gross infection.  Slight seroma. ? ?Impression: Status post left total hip arthroplasty 10/20/2021 ? ?Plan: She will work on scar tissue mobilization.  Staples removed Steri-Strips applied.  Follow-up with [place] in 4 weeks sooner if there is any questions concerns. ?

## 2021-11-30 ENCOUNTER — Ambulatory Visit (INDEPENDENT_AMBULATORY_CARE_PROVIDER_SITE_OTHER): Payer: BC Managed Care – PPO | Admitting: Physician Assistant

## 2021-11-30 ENCOUNTER — Encounter: Payer: Self-pay | Admitting: Physician Assistant

## 2021-11-30 DIAGNOSIS — Z96642 Presence of left artificial hip joint: Secondary | ICD-10-CM

## 2021-11-30 NOTE — Progress Notes (Signed)
HPI: Mrs. Schlemmer returns today status post left total hip arthroplasty 10/20/2021.  She is overall doing well.  She has made occasional pain and takes pain medication for this.  No numbness tingling.  No fevers or chills.  She does have some difficulty climbing stairs and has some discomfort in the anterior aspect when going up the stairs.  She also describes a catching feeling at times in the groin as if something is still healing. ? ?Review of systems: See HPI otherwise negative ? ? ?Physical exam: Left hip surgical incisions healing well no signs of infection wound dehiscence.  Slight keloid.  Fluid range of motion of the left hip with some discomfort with internal rotation.  Left calf supple nontender.  Dorsiflexion plantarflexion left ankle intact.  Ambulates without any assistive device. ? ? ?Impression: Status post left total hip arthroplasty 10/20/2021 ? ?Plan: She will work on scar tissue mobilization.  She will work on stretching her hip flexors and IT band as shown.  We will see her back in early May to discuss her returning to work on possibly May 15.  Questions were encouraged and answered at length today. ?

## 2021-12-25 ENCOUNTER — Ambulatory Visit (INDEPENDENT_AMBULATORY_CARE_PROVIDER_SITE_OTHER): Payer: BC Managed Care – PPO | Admitting: Orthopaedic Surgery

## 2021-12-25 ENCOUNTER — Encounter: Payer: Self-pay | Admitting: Orthopaedic Surgery

## 2021-12-25 DIAGNOSIS — Z96642 Presence of left artificial hip joint: Secondary | ICD-10-CM

## 2021-12-25 NOTE — Progress Notes (Signed)
The patient is now about 9 weeks status post a left hip replacement.  She is doing well overall.  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  She says she is doing great. ? ?Her left operative hip moves smoothly and fluidly.  She has a little bit of stiffness when she first gets up but is able to walk it off.  Her leg lengths are equal.  She has good strength. ? ?At this point I will release her for full work duties without restrictions.  Unless there are issues we do not need to see her back for 6 months.  At that visit we will have a standing low AP pelvis and a lateral of her left operative hip.  All questions and concerns were answered and addressed. ?

## 2021-12-29 ENCOUNTER — Telehealth: Payer: Self-pay | Admitting: Orthopaedic Surgery

## 2021-12-29 NOTE — Telephone Encounter (Signed)
Patient called asked if Dr. Magnus Ivan would write a letter stating she can return back to work without restrictions on 12/24/2021. Patient said she has vacation on 12/24/2021 to 12/31/2021. Patient said these days are not under FMLA. ? The number to fax the letter is:  [phone]  Case # [id number]  [employer] ID # [id number] ?The number to contact patient is [phone]  ?

## 2022-01-01 NOTE — Telephone Encounter (Signed)
Patient aware note was faxed to provided number ?

## 2022-01-29 ENCOUNTER — Telehealth: Payer: Self-pay | Admitting: Orthopaedic Surgery

## 2022-01-29 NOTE — Telephone Encounter (Signed)
Faxed to provided number  

## 2022-01-29 NOTE — Telephone Encounter (Signed)
Patient called advised her dental office need a note faxed over to them on our letter head stating patient do not need an antibiotic after 3 months post surgery.    [facility]   Attn: Dennison Mascot Fax# [phone]    The number to contact patient is [phone]

## 2022-02-26 ENCOUNTER — Other Ambulatory Visit: Payer: Self-pay | Admitting: Obstetrics and Gynecology

## 2022-02-26 DIAGNOSIS — Z1231 Encounter for screening mammogram for malignant neoplasm of breast: Secondary | ICD-10-CM

## 2022-03-15 ENCOUNTER — Ambulatory Visit
Admission: RE | Admit: 2022-03-15 | Discharge: 2022-03-15 | Disposition: A | Discharge status: Home / Self Care | Payer: BC Managed Care – PPO | Source: Ambulatory Visit | Attending: Obstetrics and Gynecology | Admitting: Obstetrics and Gynecology

## 2022-03-15 DIAGNOSIS — Z1231 Encounter for screening mammogram for malignant neoplasm of breast: Secondary | ICD-10-CM

## 2022-05-14 ENCOUNTER — Encounter: Payer: Self-pay | Admitting: *Deleted

## 2022-06-05 ENCOUNTER — Other Ambulatory Visit: Payer: Self-pay | Admitting: Sports Medicine

## 2022-06-05 ENCOUNTER — Ambulatory Visit
Admission: RE | Admit: 2022-06-05 | Discharge: 2022-06-05 | Disposition: A | Discharge status: Home / Self Care | Payer: BC Managed Care – PPO | Source: Ambulatory Visit | Attending: Sports Medicine | Admitting: Sports Medicine

## 2022-06-05 DIAGNOSIS — M25551 Pain in right hip: Secondary | ICD-10-CM

## 2022-06-05 DIAGNOSIS — M545 Low back pain, unspecified: Secondary | ICD-10-CM

## 2022-06-28 ENCOUNTER — Ambulatory Visit: Payer: BC Managed Care – PPO | Admitting: Orthopaedic Surgery

## 2022-07-02 ENCOUNTER — Encounter: Payer: Self-pay | Admitting: Orthopaedic Surgery

## 2022-07-02 ENCOUNTER — Ambulatory Visit (INDEPENDENT_AMBULATORY_CARE_PROVIDER_SITE_OTHER): Payer: BC Managed Care – PPO

## 2022-07-02 ENCOUNTER — Ambulatory Visit (INDEPENDENT_AMBULATORY_CARE_PROVIDER_SITE_OTHER): Payer: BC Managed Care – PPO | Admitting: Orthopaedic Surgery

## 2022-07-02 DIAGNOSIS — Z96642 Presence of left artificial hip joint: Secondary | ICD-10-CM

## 2022-07-02 DIAGNOSIS — M1611 Unilateral primary osteoarthritis, right hip: Secondary | ICD-10-CM | POA: Insufficient documentation

## 2022-07-02 NOTE — Progress Notes (Signed)
The patient is now 8 months status post a left total hip arthroplasty.  She says that hip is doing very well.  She has been followed by Dr. Berline Chough for recent spine issues.  I did see the x-rays of her lumbar spine showing a remote subacute L1 compression fracture.  Her pain is to the right of her mid low back.  She also has right hip and groin pain.  She said that Dr. Berline Chough did place a steroid injection in her right hip joint and that helped greatly but some of her pain is returned.  She said the left hip is doing great.  On exam she walks without a limp.  Her left hip moves smoothly and fluidly with no pain at all.  The right hip actually does have pain in the groin with extremes of rotation but there is no blocks to rotation.  An AP pelvis and lateral of her left hip shows a well-seated total hip arthroplasty with no complicating features.  The right hip has significant joint space narrowing at the superior lateral aspect with osteophytes as well.  As far as her left hip goes, follow-up for that hip will be as needed.  If her right hip starts to worsen in any way my recommendation would be hip replacement for the right hip.  If it becomes problematic she knows to let us know.  All questions and concerns were otherwise answered and addressed.

## 2022-08-02 ENCOUNTER — Encounter: Payer: Self-pay | Admitting: *Deleted

## 2022-08-20 DIAGNOSIS — M810 Age-related osteoporosis without current pathological fracture: Secondary | ICD-10-CM

## 2022-08-20 HISTORY — DX: Age-related osteoporosis without current pathological fracture: M81.0

## 2022-10-02 IMAGING — CR DG HIP (WITH OR WITHOUT PELVIS) 2-3V*L*
2 series · 2 of 2 positions shown · non-contrast
Comparison: Left hip radiograph dated 10/10/2018.

CLINICAL DATA: Left hip pain.

EXAM:
DG HIP (WITH OR WITHOUT PELVIS) 2-3V LEFT

[w hip ap left]
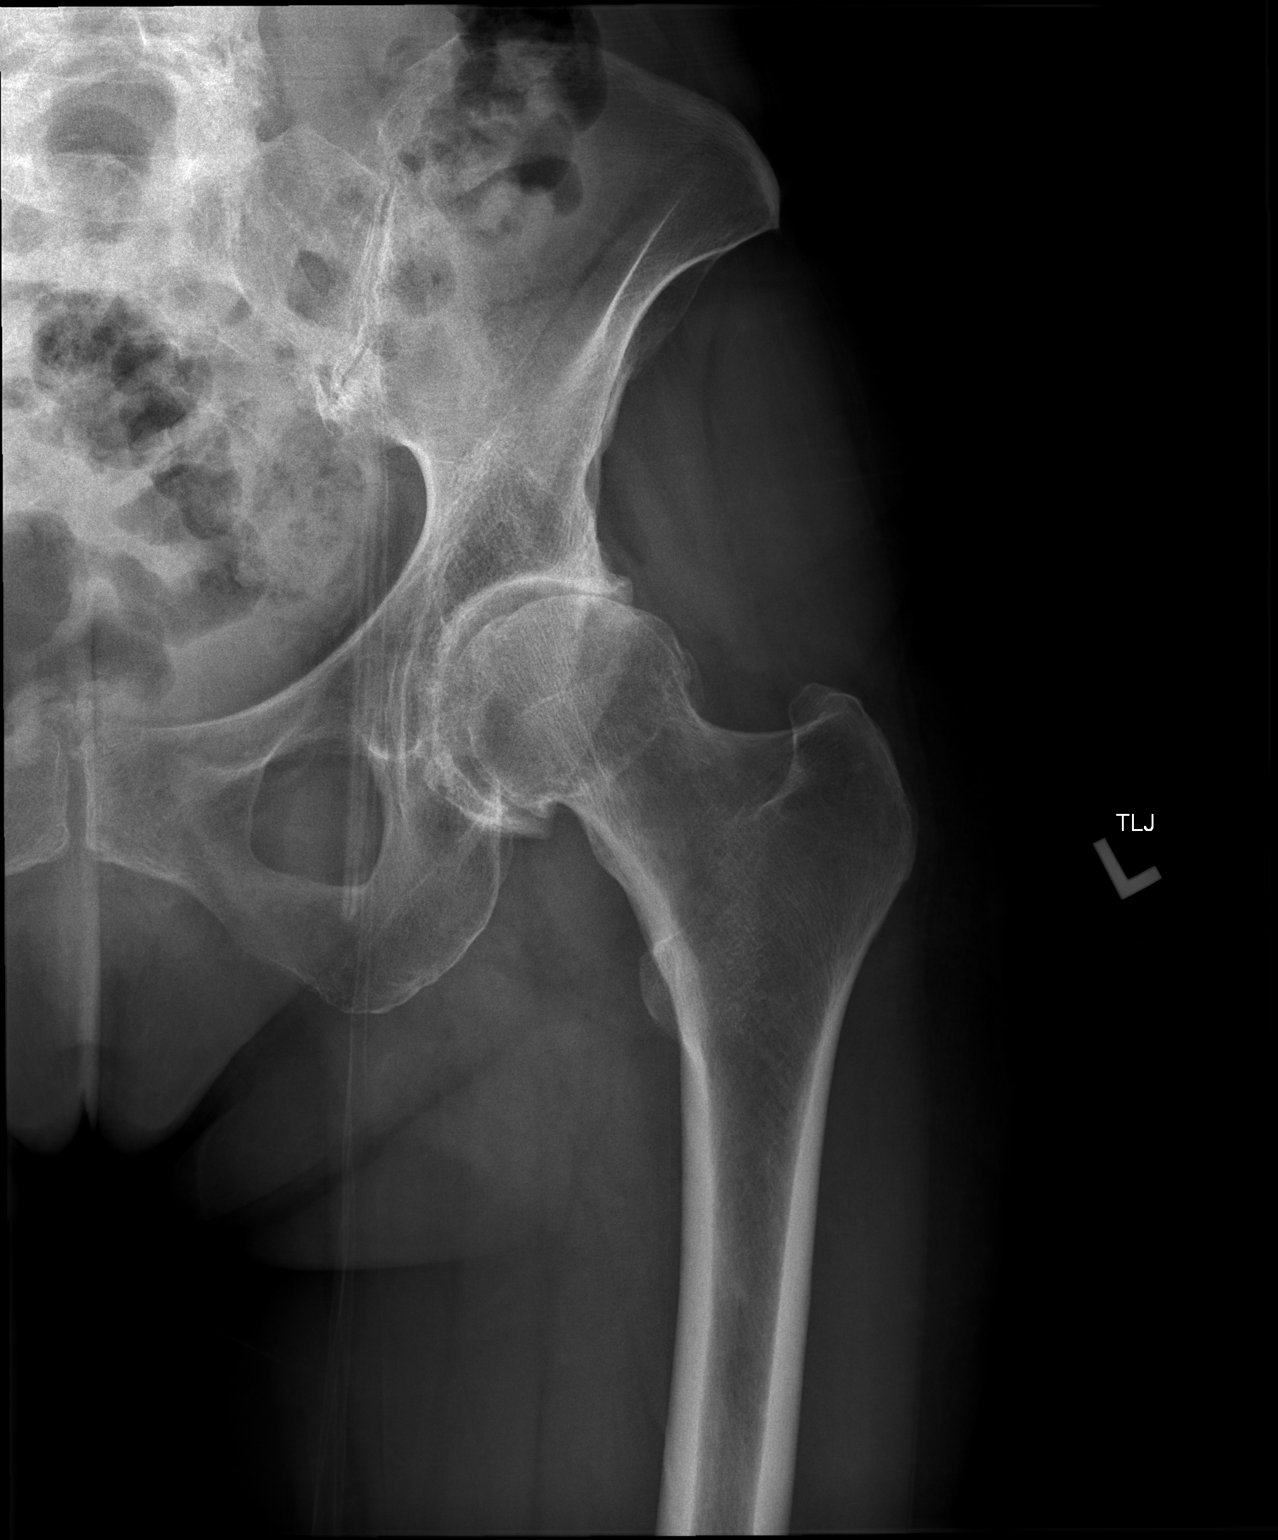

[w hip lat left]
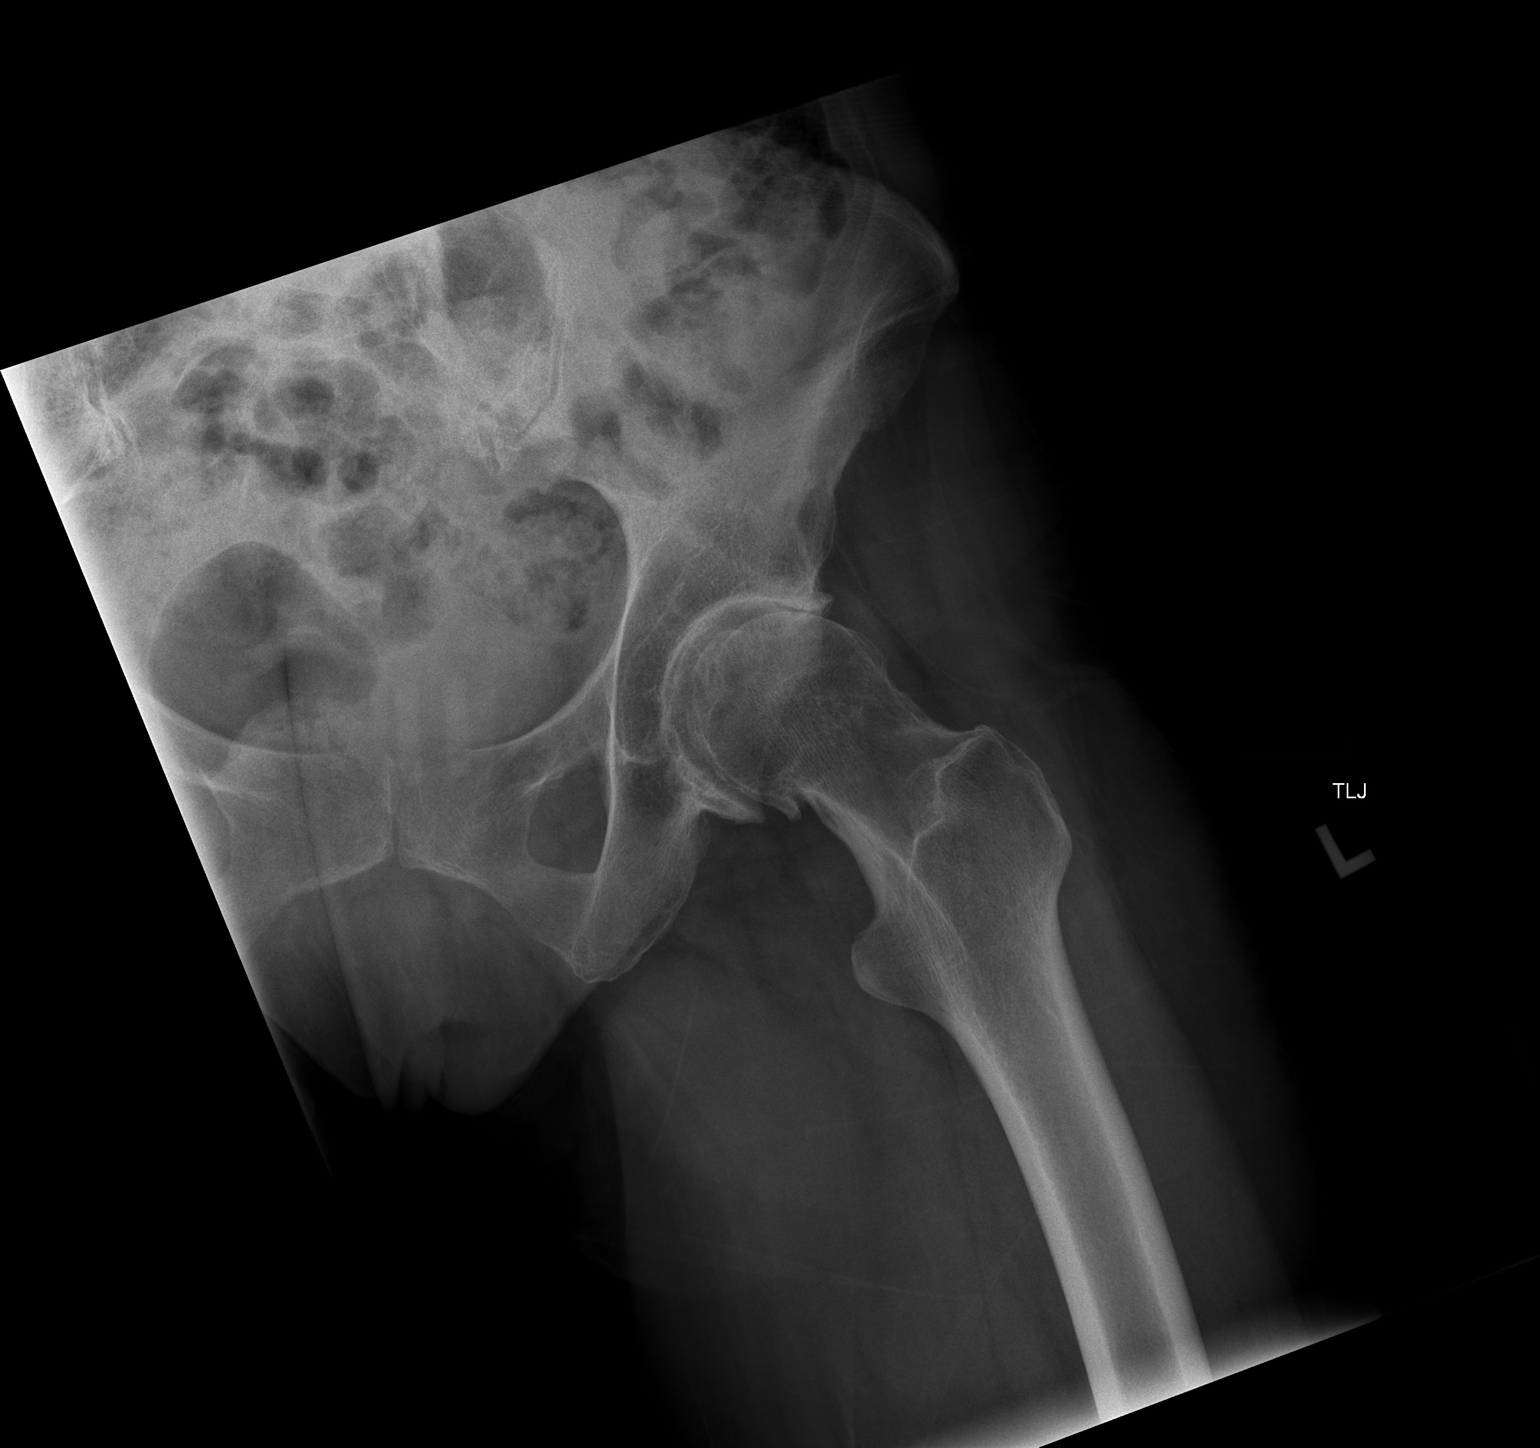

[2 of 2 positions shown; findings below may reference images not displayed]

FINDINGS: There is no acute fracture or dislocation. The bones are osteopenic.
Moderate arthritic changes of the left hip. The soft tissues are
unremarkable.
IMPRESSION: 1. No acute fracture or dislocation.
2. Moderate arthritic changes.

## 2022-10-19 DIAGNOSIS — E875 Hyperkalemia: Secondary | ICD-10-CM

## 2022-10-19 HISTORY — DX: Hyperkalemia: E87.5

## 2022-10-25 ENCOUNTER — Encounter: Payer: Self-pay | Admitting: Radiology

## 2022-10-25 NOTE — Progress Notes (Signed)
67 y.o. G0P0000 Married Caucasian female here for annual exam.    No GYN concerns.   Planning another hip replacement.   PCP:  Dr. Yong Channel  Dr. Paulla Fore - DO  Patient's last menstrual period was 08/20/2008.           Sexually active: No.  The current method of family planning is post menopausal status.    Exercising: Yes.     Rowing, ballroom dancing, light weight exercises Smoker:  former  Health Maintenance: Pap: 10/10/21 neg: HR HPV neg, 10/03/18 Neg:Neg HR HPV, 09/01/15 Neg:Neg HR HPV  History of abnormal Pap:  yes, hx of cryotherapy years ago?  Patient is unclear about her treatment in the past. She also reports a painful procedure in the past which may have been a sonohysterogram.  MMG: 03/15/22 - BI-RADS 1 Colonoscopy:  10/07/15 , 10 years BMD:   03/13/21  Result  osteopenia TDaP:  10/03/18 Gardasil:   no HIV:09/01/15 NR Hep C: 09/01/15 Neg Screening Labs:  PCP   reports that she quit smoking about 19 years ago. Her smoking use included cigarettes. She has a 2.00 pack-year smoking history. She has never used smokeless tobacco. She reports that she does not currently use alcohol. She reports that she does not use drugs.  Past Medical History:  Diagnosis Date   Anemia    hx of   Arthritis    Chicken pox    COVID 07/2020   Low vitamin D level 2019   Osteopenia 06/03/2008   SYMPTOMATIC MENOPAUSAL/FEMALE CLIMACTERIC STATES 01/15/2008   UTI (urinary tract infection)     Past Surgical History:  Procedure Laterality Date   BREAST SURGERY  1988   augmentation--Replaced 2013   TOTAL HIP ARTHROPLASTY Left 10/20/2021   Procedure: Left TOTAL HIP ARTHROPLASTY ANTERIOR APPROACH;  Surgeon: [name], MD;  Location: [facility];  Service: Orthopedics;  Laterality: Left;    Current Outpatient Medications  Medication Sig Dispense Refill   acetaminophen (TYLENOL) 500 MG tablet Take 500-1,000 mg by mouth every 6 (six) hours as needed for moderate pain.     celecoxib (CELEBREX) 100  MG capsule Take 100 mg by mouth 2 (two) times daily as needed for pain.     Multiple Vitamin (MULTIVITAMIN) tablet Take 1 tablet by mouth daily.     No current facility-administered medications for this visit.    Family History  Problem Relation Age of Onset   Cancer Father        unknown origin/cause--he was an alcoholic   Alcohol abuse Father        estranged from her life   Other Mother        Dec 67 with complications from Shy-Drager Syndrome   Lung cancer Maternal Grandfather        heavy smoker   Other Paternal Grandmother        she died after father's birth    Review of Systems  All other systems reviewed and are negative.   Exam:   BP 120/74 (BP Location: Right Arm, Patient Position: Sitting, Cuff Size: Normal)   Pulse 84   Ht 5\' 4"  (1.626 m)   Wt 119 lb (54 kg)   LMP 08/20/2008   SpO2 90%   BMI 20.43 kg/m     General appearance: alert, cooperative and appears stated age Head: normocephalic, without obvious abnormality, atraumatic Neck: no adenopathy, supple, symmetrical, trachea midline and thyroid normal to inspection and palpation Lungs: clear to auscultation bilaterally Breasts: bilateral implants present,  no masses or tenderness, No nipple retraction or dimpling, No nipple discharge or bleeding, No axillary adenopathy Heart: regular rate and rhythm Abdomen: soft, non-tender; no masses, no organomegaly Extremities: extremities normal, atraumatic, no cyanosis or edema Skin: skin color, texture, turgor normal. No rashes or lesions Lymph nodes: cervical, supraclavicular, and axillary nodes normal. Neurologic: grossly normal  Pelvic: External genitalia:  no lesions              No abnormal inguinal nodes palpated.              Urethra:  normal appearing urethra with no masses, tenderness or lesions              Bartholins and Skenes: normal                 Vagina: normal appearing vagina with normal color and discharge, atrophy noted.               Cervix:  no lesions              Pap taken: no Bimanual Exam:  Uterus:  normal size, contour, position, consistency, mobility, non-tender              Adnexa: no mass, fullness, tenderness              Rectal exam: yes.  Confirms.              Anus:  normal sphincter tone, no lesions  Chaperone was present for exam:  Raquel Sarna  Assessment:   Well woman visit with gynecologic exam. 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  Hx abnormal pap. Atrophy of vagina.   Menopause. Osteopenia.  Vit D deficiency.  Plan: Mammogram screening discussed. Self breast awareness reviewed. Pap and HR HPV as above. Guidelines for Calcium, Vitamin D, regular exercise program including cardiovascular and weight bearing exercise. We discussed treatment options for atrophy:  water based lubricants, cooking oils, vaginal vitamin E, and vaginal estrogen therapies.  Patient may try vaginal vitamin E.  Routine labs.  BMD this summer.  Follow up annually and prn.   After visit summary provided.

## 2022-11-08 ENCOUNTER — Other Ambulatory Visit: Payer: Self-pay | Admitting: Obstetrics and Gynecology

## 2022-11-08 ENCOUNTER — Encounter: Payer: Self-pay | Admitting: Obstetrics and Gynecology

## 2022-11-08 ENCOUNTER — Ambulatory Visit (INDEPENDENT_AMBULATORY_CARE_PROVIDER_SITE_OTHER): Payer: BC Managed Care – PPO | Admitting: Obstetrics and Gynecology

## 2022-11-08 VITALS — BP 120/74 | HR 84 | Ht 64.0 in | Wt 119.0 lb

## 2022-11-08 DIAGNOSIS — Z01419 Encounter for gynecological examination (general) (routine) without abnormal findings: Secondary | ICD-10-CM

## 2022-11-08 DIAGNOSIS — Z1231 Encounter for screening mammogram for malignant neoplasm of breast: Secondary | ICD-10-CM

## 2022-11-08 DIAGNOSIS — Z0000 Encounter for general adult medical examination without abnormal findings: Secondary | ICD-10-CM

## 2022-11-08 DIAGNOSIS — Z780 Asymptomatic menopausal state: Secondary | ICD-10-CM

## 2022-11-08 DIAGNOSIS — M8580 Other specified disorders of bone density and structure, unspecified site: Secondary | ICD-10-CM | POA: Diagnosis not present

## 2022-11-08 NOTE — Patient Instructions (Signed)

## 2022-11-09 LAB — LIPID PANEL
Cholesterol: 202 mg/dL — ABNORMAL HIGH (ref <200)
HDL: 80 mg/dL (ref >=50)
LDL Cholesterol (Calc): 105 mg/dL (calc) — ABNORMAL HIGH
Non-HDL Cholesterol (Calc): 122 mg/dL (calc) (ref <130)
Total CHOL/HDL Ratio: 2.5 (calc) (ref <5.0)
Triglycerides: 81 mg/dL (ref <150)

## 2022-11-09 LAB — COMPREHENSIVE METABOLIC PANEL
AG Ratio: 1.7 (calc) (ref 1.0–2.5)
ALT: 15 U/L (ref 6–29)
AST: 20 U/L (ref 10–35)
Albumin: 4.8 g/dL (ref 3.6–5.1)
Alkaline phosphatase (APISO): 74 U/L (ref 37–153)
BUN: 19 mg/dL (ref 7–25)
CO2: 31 mmol/L (ref 20–32)
Calcium: 10.2 mg/dL (ref 8.6–10.4)
Chloride: 99 mmol/L (ref 98–110)
Creat: 0.56 mg/dL (ref 0.50–1.05)
Globulin: 2.9 g/dL (calc) (ref 1.9–3.7)
Glucose, Bld: 91 mg/dL (ref 65–99)
Potassium: 5.8 mmol/L — ABNORMAL HIGH (ref 3.5–5.3)
Sodium: 139 mmol/L (ref 135–146)
Total Bilirubin: 0.5 mg/dL (ref 0.2–1.2)
Total Protein: 7.7 g/dL (ref 6.1–8.1)

## 2022-11-09 LAB — CBC
HCT: 41.6 % (ref 35.0–45.0)
Hemoglobin: 13.7 g/dL (ref 11.7–15.5)
MCH: 32.8 pg (ref 27.0–33.0)
MCHC: 32.9 g/dL (ref 32.0–36.0)
MCV: 99.5 fL (ref 80.0–100.0)
MPV: 11.4 fL (ref 7.5–12.5)
Platelets: 351 10*3/uL (ref 140–400)
RBC: 4.18 10*6/uL (ref 3.80–5.10)
RDW: 12.4 % (ref 11.0–15.0)
WBC: 6.3 10*3/uL (ref 3.8–10.8)

## 2022-11-09 LAB — TSH: TSH: 1.64 mIU/L (ref 0.40–4.50)

## 2022-11-09 LAB — VITAMIN D 25 HYDROXY (VIT D DEFICIENCY, FRACTURES): Vit D, 25-Hydroxy: 58 ng/mL (ref 30–100)

## 2022-11-11 ENCOUNTER — Encounter: Payer: Self-pay | Admitting: Obstetrics and Gynecology

## 2022-11-11 ENCOUNTER — Other Ambulatory Visit: Payer: Self-pay | Admitting: Obstetrics and Gynecology

## 2022-11-11 DIAGNOSIS — E875 Hyperkalemia: Secondary | ICD-10-CM

## 2022-11-21 ENCOUNTER — Telehealth: Payer: Self-pay | Admitting: Obstetrics and Gynecology

## 2022-11-21 NOTE — Telephone Encounter (Signed)
Please contact patient with results of her testing done 11/08/22.  I believe she did not formally receive results.   Her potassium is elevated and needs repeating.  Her level was 5.8 and the upper limit of normal is 5.3. I recommend she come to the office for a lab visit within the next week to have this repeated.   Her cholesterol panel showed minimally elevated LDL cholesterol, but her ratios were normal.   Her blood counts, vitamin D, and thyroid testing are all normal.

## 2022-11-22 NOTE — Telephone Encounter (Signed)
Spoke with patient and informed her. Lab appointment scheduled for 11/28/22 at 2:00pm.

## 2022-11-22 NOTE — Telephone Encounter (Signed)
Encounter reviewed and closed.  

## 2022-11-28 ENCOUNTER — Other Ambulatory Visit: Payer: BC Managed Care – PPO

## 2022-11-28 DIAGNOSIS — E875 Hyperkalemia: Secondary | ICD-10-CM

## 2022-11-29 LAB — BASIC METABOLIC PANEL
BUN: 16 mg/dL (ref 7–25)
CO2: 29 mmol/L (ref 20–32)
Calcium: 9.9 mg/dL (ref 8.6–10.4)
Chloride: 98 mmol/L (ref 98–110)
Creat: 0.67 mg/dL (ref 0.50–1.05)
Glucose, Bld: 96 mg/dL (ref 65–99)
Potassium: 5.1 mmol/L (ref 3.5–5.3)
Sodium: 139 mmol/L (ref 135–146)

## 2023-03-10 ENCOUNTER — Telehealth: Payer: BC Managed Care – PPO | Admitting: Physician Assistant

## 2023-03-10 DIAGNOSIS — U071 COVID-19: Secondary | ICD-10-CM | POA: Diagnosis not present

## 2023-03-10 MED ORDER — NIRMATRELVIR/RITONAVIR (PAXLOVID)TABLET: 3.0000 | ORAL_TABLET | Freq: Two times a day (BID) | ORAL | 0 refills | Status: AC

## 2023-03-10 MED ORDER — PROMETHAZINE-DM 6.25-15 MG/5ML PO SYRP: 5.0000 mL | ORAL_SOLUTION | Freq: Four times a day (QID) | ORAL | 0 refills | Status: DC | PRN

## 2023-03-10 NOTE — Progress Notes (Signed)
Virtual Visit Consent   Bridget Mccarthy, you are scheduled for a virtual visit with a [facility] provider today. Just as with appointments in the office, your consent must be obtained to participate. Your consent will be active for this visit and any virtual visit you may have with one of our providers in the next 365 days. If you have a MyChart account, a copy of this consent can be sent to you electronically.  As this is a virtual visit, video technology does not allow for your provider to perform a traditional examination. This may limit your provider's ability to fully assess your condition. If your provider identifies any concerns that need to be evaluated in person or the need to arrange testing (such as labs, EKG, etc.), we will make arrangements to do so. Although advances in technology are sophisticated, we cannot ensure that it will always work on either your end or our end. If the connection with a video visit is poor, the visit may have to be switched to a telephone visit. With either a video or telephone visit, we are not always able to ensure that we have a secure connection.  By engaging in this virtual visit, you consent to the provision of healthcare and authorize for your insurance to be billed (if applicable) for the services provided during this visit. Depending on your insurance coverage, you may receive a charge related to this service.  I need to obtain your verbal consent now. Are you willing to proceed with your visit today? Bridget Mccarthy has provided verbal consent on 03/10/2023 for a virtual visit (video or telephone). [name], PA-C  Date: 03/10/2023 3:34 PM  Virtual Visit via Video Note   I, [name], connected with  Bridget Mccarthy  ([id number], [birth year]) on 03/10/23 at  3:30 PM EDT by a video-enabled telemedicine application and verified that I am speaking with the correct person using two identifiers.  Location: Patient:  Virtual Visit Location Patient: Home Provider: Virtual Visit Location Provider: Home Office   I discussed the limitations of evaluation and management by telemedicine and the availability of in person appointments. The patient expressed understanding and agreed to proceed.    History of Present Illness: Bridget Mccarthy is a 67 y.o. who identifies as a female who was assigned female at birth, and is being seen today for Covid 54.  HPI: URI  This is a new problem. Episode onset: Tested positive for Covid 19; Symptoms started yesterday, 03/09/23. There has been no fever. Associated symptoms include congestion, coughing (dry), ear pain, headaches and sinus pain. Pertinent negatives include no diarrhea, nausea, plugged ear sensation, rhinorrhea, sore throat or vomiting. Associated symptoms comments: Brain fog. She has tried acetaminophen for the symptoms. The treatment provided no relief.     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 Didn't have to take benadryl or otherthough and resolved   Medications:  Current Outpatient Medications:    nirmatrelvir/ritonavir (PAXLOVID) 20 x 150 MG & 10 x 100MG  TABS, Take 3 tablets by mouth 2 (two) times daily for 5 days. (Take nirmatrelvir 150 mg two tablets twice daily  for 5 days and ritonavir 100 mg one tablet twice daily for 5 days) Patient GFR is 96, Disp: 30 tablet, Rfl: 0   promethazine-dextromethorphan (PROMETHAZINE-DM) 6.25-15 MG/5ML syrup, Take 5 mLs by mouth 4 (four) times daily as needed., Disp: 118 mL, Rfl: 0   acetaminophen (TYLENOL) 500 MG tablet, Take 500-1,000 mg by  mouth every 6 (six) hours as needed for moderate pain., Disp: , Rfl:    celecoxib (CELEBREX) 100 MG capsule, Take 100 mg by mouth 2 (two) times daily as needed for pain., Disp: , Rfl:    Multiple Vitamin (MULTIVITAMIN) tablet, Take 1 tablet by mouth daily., Disp: , Rfl:   Observations/Objective: Patient is well-developed, well-nourished in no acute distress.  Resting comfortably at home.  Head is normocephalic, atraumatic.  No labored breathing.  Speech is clear and coherent with logical content.  Patient is alert and oriented at baseline.    Assessment and Plan: 1. COVID-19 - nirmatrelvir/ritonavir (PAXLOVID) 20 x 150 MG & 10 x 100MG  TABS; Take 3 tablets by mouth 2 (two) times daily for 5 days. (Take nirmatrelvir 150 mg two tablets twice daily for 5 days and ritonavir 100 mg one tablet twice daily for 5 days) Patient GFR is 96  Dispense: 30 tablet; Refill: 0 - MyChart COVID-19 home monitoring program; Future - promethazine-dextromethorphan (PROMETHAZINE-DM) 6.25-15 MG/5ML syrup; Take 5 mLs by mouth 4 (four) times daily as needed.  Dispense: 118 mL; Refill: 0  - Continue OTC symptomatic management of choice - Will send OTC vitamins and supplement information through AVS - Paxlovid and Promethazine DM prescribed - Patient enrolled in MyChart symptom monitoring - Push fluids - Rest as needed - Discussed return precautions and when to seek in-person evaluation, sent via AVS as well   Follow Up Instructions: I discussed the assessment and treatment plan with the patient. The patient was provided an opportunity to ask questions and all were answered. The patient agreed with the plan and demonstrated an understanding of the instructions.  A copy of instructions were sent to the patient via MyChart unless otherwise noted below.    The patient was advised to call back or seek an in-person evaluation if the symptoms worsen or if the condition fails to improve as anticipated.  Time:  I spent  10 minutes with the patient via telehealth technology discussing the above problems/concerns.    [name], PA-C

## 2023-03-10 NOTE — Patient Instructions (Signed)
Bridget [facility], thank you for joining [name], PA-C for today's virtual visit.  While this provider is not your primary care provider (PCP), if your PCP is located in our provider database this encounter information will be shared with them immediately following your visit.   A [facility] MyChart account gives you access to today's visit and all your visits, tests, and labs performed at [facility] " click here if you don't have a [facility] MyChart account or go to [email or web address]  Consent: (Patient) Bridget [facility] provided verbal consent for this virtual visit at the beginning of the encounter.  Current Medications:  Current Outpatient Medications:    nirmatrelvir/ritonavir (PAXLOVID) 20 x 150 MG & 10 x 100MG  TABS, Take 3 tablets by mouth 2 (two) times daily for 5 days. (Take nirmatrelvir 150 mg two tablets twice daily for 5 days and ritonavir 100 mg one tablet twice daily for 5 days) Patient GFR is 96, Disp: 30 tablet, Rfl: 0   promethazine-dextromethorphan (PROMETHAZINE-DM) 6.25-15 MG/5ML syrup, Take 5 mLs by mouth 4 (four) times daily as needed., Disp: 118 mL, Rfl: 0   acetaminophen (TYLENOL) 500 MG tablet, Take 500-1,000 mg by mouth every 6 (six) hours as needed for moderate pain., Disp: , Rfl:    celecoxib (CELEBREX) 100 MG capsule, Take 100 mg by mouth 2 (two) times daily as needed for pain., Disp: , Rfl:    Multiple Vitamin (MULTIVITAMIN) tablet, Take 1 tablet by mouth daily., Disp: , Rfl:    Medications ordered in this encounter:  Meds ordered this encounter  Medications   nirmatrelvir/ritonavir (PAXLOVID) 20 x 150 MG & 10 x 100MG  TABS    Sig: Take 3 tablets by mouth 2 (two) times daily for 5 days. (Take nirmatrelvir 150 mg two tablets twice daily for 5 days and ritonavir 100 mg one tablet twice daily for 5 days) Patient GFR is 96    Dispense:  30 tablet    Refill:  0    Order Specific Question:   Supervising Provider    Answer:    Merrilee Jansky [[id number]]   promethazine-dextromethorphan (PROMETHAZINE-DM) 6.25-15 MG/5ML syrup    Sig: Take 5 mLs by mouth 4 (four) times daily as needed.    Dispense:  118 mL    Refill:  0    Order Specific Question:   Supervising Provider    Answer:   Merrilee Jansky [[id number]]     *If you need refills on other medications prior to your next appointment, please contact your pharmacy*  Follow-Up: Call back or seek an in-person evaluation if the symptoms worsen or if the condition fails to improve as anticipated.  [facility] [phone]  Care Instructions:  Paxlovid (Nirmatrelvir; Ritonavir) Tablets What is this medication? NIRMATRELVIR; RITONAVIR (NIR ma TREL vir; ri TOE na veer) treats mild to moderate COVID-19. It may help people who are at high risk of developing severe illness. It works by limiting the spread of the virus in your body. This medicine may be used for other purposes; ask your health care provider or pharmacist if you have questions. COMMON BRAND NAME(S): PAXLOVID What should I tell my care team before I take this medication? They need to know if you have any of these conditions: Any allergies Any serious illness Kidney disease Liver disease An unusual or allergic reaction to nirmatrelvir, ritonavir, other medications, foods, dyes, or preservatives Pregnant or trying to get pregnant Breast-feeding How should I use this medication? This  product contains 2 different medications that are packaged together. For the standard dose, take 2 pink tablets of nirmatrelvir with 1 white tablet of ritonavir (3 tablets total) by mouth with water twice daily. Talk to your care team if you have kidney disease. You may need a different dose. Swallow the tablets whole. You can take it with or without food. If it upsets your stomach, take it with food. Take all of this medication unless your care team tells you to stop it early. Keep taking it even if you think  you are better. Talk to your care team about the use of this medication in children. While it may be prescribed for children as young as 12 years for selected conditions, precautions do apply. Overdosage: If you think you have taken too much of this medicine contact a poison control [facility] or emergency room at once. NOTE: This medicine is only for you. Do not share this medicine with others. What if I miss a dose? If you miss a dose, take it as soon as you can unless it is more than 8 hours late. If it is more than 8 hours late, skip the missed dose. Take the next dose at the normal time. Do not take extra or 2 doses at the same time to make up for the missed dose. What may interact with this medication? Do not take this medication with any of the following: Alfuzosin Certain medications for anxiety or sleep, such as midazolam or triazolam Certain medications for cancer, such as apalutamide Certain medications for cholesterol, such as lovastatin or simvastatin Certain medications for irregular heartbeat, such as amiodarone, dronedarone, flecainide, propafenone, quinidine Certain medications for mental health conditions, such as lurasidone or pimozide Certain medications for seizures, such as carbamazepine, phenobarbital, phenytoin, primidone Colchicine Eletriptan Eplerenone Ergot alkaloids, such as dihydroergotamine, ergotamine, methylergonovine Finerenone Flibanserin Ivabradine Lomitapide Lumacaftor; ivacaftor Naloxegol Ranolazine Red Yeast Rice Rifampin Rifapentine Sildenafil Silodosin [facility]. John's wort Tolvaptan Ubrogepant Voclosporin This medication may affect how other medications work, and other medications may affect the way this medication works. Talk with your care team about all of the medications you take. They may suggest changes to your treatment plan to lower the risk of side effects and to make sure your medications work as intended. This list may not describe all  possible interactions. Give your health care provider a list of all the medicines, herbs, non-prescription drugs, or dietary supplements you use. Also tell them if you smoke, drink alcohol, or use illegal drugs. Some items may interact with your medicine. What should I watch for while using this medication? Your condition will be monitored carefully while you are receiving this medication. Visit your care team for regular checkups. Tell your care team if your symptoms do not start to get better or if they get worse. If you have untreated HIV infection, this medication may lead to some HIV medications not working as well in the future. Estrogen and progestin hormones may not work as well while you are taking this medication. Your care team can help you find the contraceptive option that works for you. What side effects may I notice from receiving this medication? Side effects that you should report to your care team as soon as possible: Allergic reactions--skin rash, itching, hives, swelling of the face, lips, tongue, or throat Liver injury--right upper belly pain, loss of appetite, nausea, light-colored stool, dark yellow or brown urine, yellowing skin or eyes, unusual weakness or fatigue Redness, blistering, peeling, or loosening of  the skin, including inside the mouth Side effects that usually do not require medical attention (report these to your care team if they continue or are bothersome): Change in taste Diarrhea General discomfort and fatigue Increase in blood pressure Muscle pain Nausea Stomach pain This list may not describe all possible side effects. Call your doctor for medical advice about side effects. You may report side effects to FDA at 1-800-FDA-1088. Where should I keep my medication? Keep out of the reach of children and pets. Store at room temperature between 20 and 25 degrees C (68 and 77 degrees F). Get rid of any unused medication after the expiration date. To get rid of  medications that are no longer needed or have expired: Take the medication to a medication take-back program. Check with your pharmacy or law enforcement to find a location. If you cannot return the medication, check the label or package insert to see if the medication should be thrown out in the garbage or flushed down the toilet. If you are not sure, ask your care team. If it is safe to put it in the trash, take the medication out of the container. Mix the medication with cat litter, dirt, coffee grounds, or other unwanted substance. Seal the mixture in a bag or container. Put it in the trash. NOTE: This sheet is a summary. It may not cover all possible information. If you have questions about this medicine, talk to your doctor, pharmacist, or health care provider.  2024 Elsevier/Gold Standard (2022-09-24 00:00:00)    Isolation Instructions: You are to isolate at home until you have been fever free for at least 24 hours without a fever-reducing medication, and symptoms have been steadily improving for 24 hours. At that time,  you can end isolation but need to mask for an additional 5 days.   If you must be around other household members who do not have symptoms, you need to make sure that both you and the family members are masking consistently with a high-quality mask.  If you note any worsening of symptoms despite treatment, please seek an in-person evaluation ASAP. If you note any significant shortness of breath or any chest pain, please seek ER evaluation. Please do not delay care!   COVID-19: What to Do if You Are Sick If you test positive and are an older adult or someone who is at high risk of getting very sick from COVID-19, treatment may be available. Contact a healthcare provider right away after a positive test to determine if you are eligible, even if your symptoms are mild right now. You can also visit a Test to Treat location and, if eligible, receive a prescription from a provider.  Don't delay: Treatment must be started within the first few days to be effective. If you have a fever, cough, or other symptoms, you might have COVID-19. Most people have mild illness and are able to recover at home. If you are sick: Keep track of your symptoms. If you have an emergency warning sign (including trouble breathing), call 911. Steps to help prevent the spread of COVID-19 if you are sick If you are sick with COVID-19 or think you might have COVID-19, follow the steps below to care for yourself and to help protect other people in your home and community. Stay home except to get medical care Stay home. Most people with COVID-19 have mild illness and can recover at home without medical care. Do not leave your home, except to get medical care. Do  not visit public areas and do not go to places where you are unable to wear a mask. Take care of yourself. Get rest and stay hydrated. Take over-the-counter medicines, such as acetaminophen, to help you feel better. Stay in touch with your doctor. Call before you get medical care. Be sure to get care if you have trouble breathing, or have any other emergency warning signs, or if you think it is an emergency. Avoid public transportation, ride-sharing, or taxis if possible. Get tested If you have symptoms of COVID-19, get tested. While waiting for test results, stay away from others, including staying apart from those living in your household. Get tested as soon as possible after your symptoms start. Treatments may be available for people with COVID-19 who are at risk for becoming very sick. Don't delay: Treatment must be started early to be effective--some treatments must begin within 5 days of your first symptoms. Contact your healthcare provider right away if your test result is positive to determine if you are eligible. Self-tests are one of several options for testing for the virus that causes COVID-19 and may be more convenient than  laboratory-based tests and point-of-care tests. Ask your healthcare provider or your local health department if you need help interpreting your test results. You can visit your state, tribal, local, and territorial health department's website to look for the latest local information on testing sites. Separate yourself from other people As much as possible, stay in a specific room and away from other people and pets in your home. If possible, you should use a separate bathroom. If you need to be around other people or animals in or outside of the home, wear a well-fitting mask. Tell your close contacts that they may have been exposed to COVID-19. An infected person can spread COVID-19 starting 48 hours (or 2 days) before the person has any symptoms or tests positive. By letting your close contacts know they may have been exposed to COVID-19, you are helping to protect everyone. See COVID-19 and Animals if you have questions about pets. If you are diagnosed with COVID-19, someone from the health department may call you. Answer the call to slow the spread. Monitor your symptoms Symptoms of COVID-19 include fever, cough, or other symptoms. Follow care instructions from your healthcare provider and local health department. Your local health authorities may give instructions on checking your symptoms and reporting information. When to seek emergency medical attention Look for emergency warning signs* for COVID-19. If someone is showing any of these signs, seek emergency medical care immediately: Trouble breathing Persistent pain or pressure in the chest New confusion Inability to wake or stay awake Pale, gray, or blue-colored skin, lips, or nail beds, depending on skin tone *This list is not all possible symptoms. Please call your medical provider for any other symptoms that are severe or concerning to you. Call 911 or call ahead to your local emergency facility: Notify the operator that you are seeking  care for someone who has or may have COVID-19. Call ahead before visiting your doctor Call ahead. Many medical visits for routine care are being postponed or done by phone or telemedicine. If you have a medical appointment that cannot be postponed, call your doctor's office, and tell them you have or may have COVID-19. This will help the office protect themselves and other patients. If you are sick, wear a well-fitting mask You should wear a mask if you must be around other people or animals, including pets (even at  home). Wear a mask with the best fit, protection, and comfort for you. You don't need to wear the mask if you are alone. If you can't put on a mask (because of trouble breathing, for example), cover your coughs and sneezes in some other way. Try to stay at least 6 feet away from other people. This will help protect the people around you. Masks should not be placed on young children under age 63 years, anyone who has trouble breathing, or anyone who is not able to remove the mask without help. Cover your coughs and sneezes Cover your mouth and nose with a tissue when you cough or sneeze. Throw away used tissues in a lined trash can. Immediately wash your hands with soap and water for at least 20 seconds. If soap and water are not available, clean your hands with an alcohol-based hand sanitizer that contains at least 60% alcohol. Clean your hands often Wash your hands often with soap and water for at least 20 seconds. This is especially important after blowing your nose, coughing, or sneezing; going to the bathroom; and before eating or preparing food. Use hand sanitizer if soap and water are not available. Use an alcohol-based hand sanitizer with at least 60% alcohol, covering all surfaces of your hands and rubbing them together until they feel dry. Soap and water are the best option, especially if hands are visibly dirty. Avoid touching your eyes, nose, and mouth with unwashed  hands. Handwashing Tips Avoid sharing personal household items Do not share dishes, drinking glasses, cups, eating utensils, towels, or bedding with other people in your home. Wash these items thoroughly after using them with soap and water or put in the dishwasher. Clean surfaces in your home regularly Clean and disinfect high-touch surfaces (for example, doorknobs, tables, handles, light switches, and countertops) in your "sick room" and bathroom. In shared spaces, you should clean and disinfect surfaces and items after each use by the person who is ill. If you are sick and cannot clean, a caregiver or other person should only clean and disinfect the area around you (such as your bedroom and bathroom) on an as needed basis. Your caregiver/other person should wait as long as possible (at least several hours) and wear a mask before entering, cleaning, and disinfecting shared spaces that you use. Clean and disinfect areas that may have blood, stool, or body fluids on them. Use household cleaners and disinfectants. Clean visible dirty surfaces with household cleaners containing soap or detergent. Then, use a household disinfectant. Use a product from [employer] List N: Disinfectants for Coronavirus (COVID-19). Be sure to follow the instructions on the label to ensure safe and effective use of the product. Many products recommend keeping the surface wet with a disinfectant for a certain period of time (look at "contact time" on the product label). You may also need to wear personal protective equipment, such as gloves, depending on the directions on the product label. Immediately after disinfecting, wash your hands with soap and water for 20 seconds. For completed guidance on cleaning and disinfecting your home, visit Complete Disinfection Guidance. Take steps to improve ventilation at home Improve ventilation (air flow) at home to help prevent from spreading COVID-19 to other people in your  household. Clear out COVID-19 virus particles in the air by opening windows, using air filters, and turning on fans in your home. Use this interactive tool to learn how to improve air flow in your home. When you can be around others after being  sick with COVID-19 Deciding when you can be around others is different for different situations. Find out when you can safely end home isolation. For any additional questions about your care, contact your healthcare provider or state or local health department. 11/08/2020 Content source: [facility] ([facility]), Division of Viral Diseases This information is not intended to replace advice given to you by your health care provider. Make sure you discuss any questions you have with your health care provider. Document Revised: 12/22/2020 Document Reviewed: 12/22/2020 Elsevier Patient Education  2022 [employer].     If you have been instructed to have an in-person evaluation today at a local Urgent Care facility, please use the link below. It will take you to a list of all of our available [facility], including address, phone number and hours of operation. Please do not delay care.  [facility]  If you or a family member do not have a primary care provider, use the link below to schedule a visit and establish care. When you choose a [facility] primary care physician or advanced practice provider, you gain a long-term partner in health. Find a Primary Care Provider  Learn more about [facility]'s in-office and virtual care options: [facility] [facility] - Get Care Now

## 2023-03-18 ENCOUNTER — Ambulatory Visit
Admission: RE | Admit: 2023-03-18 | Discharge: 2023-03-18 | Disposition: A | Discharge status: Home / Self Care | Payer: BC Managed Care – PPO | Source: Ambulatory Visit | Attending: Obstetrics and Gynecology | Admitting: Obstetrics and Gynecology

## 2023-03-18 DIAGNOSIS — Z1231 Encounter for screening mammogram for malignant neoplasm of breast: Secondary | ICD-10-CM

## 2023-04-12 ENCOUNTER — Ambulatory Visit
Admission: RE | Admit: 2023-04-12 | Discharge: 2023-04-12 | Disposition: A | Discharge status: Home / Self Care | Payer: BC Managed Care – PPO | Source: Ambulatory Visit | Attending: Sports Medicine | Admitting: Sports Medicine

## 2023-04-12 ENCOUNTER — Other Ambulatory Visit: Payer: Self-pay | Admitting: Sports Medicine

## 2023-04-12 DIAGNOSIS — M545 Low back pain, unspecified: Secondary | ICD-10-CM

## 2023-04-12 DIAGNOSIS — M25552 Pain in left hip: Secondary | ICD-10-CM

## 2023-04-12 DIAGNOSIS — M1612 Unilateral primary osteoarthritis, left hip: Secondary | ICD-10-CM

## 2023-05-01 ENCOUNTER — Telehealth: Payer: Self-pay

## 2023-05-01 NOTE — Telephone Encounter (Signed)
Patient having problems with right hip.  Had new x-rays at [facility] and seeing Dr. Berline Chough tomorrow.  She would like to have THA before the end of the year.  Schedule or does she need OV first?

## 2023-05-16 ENCOUNTER — Ambulatory Visit
Admission: RE | Admit: 2023-05-16 | Discharge: 2023-05-16 | Disposition: A | Discharge status: Home / Self Care | Payer: BC Managed Care – PPO | Source: Ambulatory Visit | Attending: Obstetrics and Gynecology | Admitting: Obstetrics and Gynecology

## 2023-05-16 DIAGNOSIS — Z780 Asymptomatic menopausal state: Secondary | ICD-10-CM

## 2023-05-16 DIAGNOSIS — M8580 Other specified disorders of bone density and structure, unspecified site: Secondary | ICD-10-CM

## 2023-05-17 ENCOUNTER — Encounter: Payer: Self-pay | Admitting: Obstetrics and Gynecology

## 2023-05-22 NOTE — Telephone Encounter (Signed)
I called patient and scheduled surgery as well as pre-surgery ROV.

## 2023-06-05 ENCOUNTER — Ambulatory Visit: Payer: BC Managed Care – PPO | Admitting: Orthopaedic Surgery

## 2023-06-05 ENCOUNTER — Encounter: Payer: Self-pay | Admitting: Orthopaedic Surgery

## 2023-06-05 DIAGNOSIS — M1611 Unilateral primary osteoarthritis, right hip: Secondary | ICD-10-CM | POA: Diagnosis not present

## 2023-06-05 DIAGNOSIS — Z96642 Presence of left artificial hip joint: Secondary | ICD-10-CM | POA: Diagnosis not present

## 2023-06-05 NOTE — Progress Notes (Signed)
The patient is very well-known to me.  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  We actually last saw her in November of last year and had diagnosed her arthritis then.  She is a very tough individual and is scheduled for hip replacement surgery next month.  She is 66.  She is thin.  She is active.  She is young appearing.  She does not smoke and does not drink.  She does not have any active medical issues at all.  She is not diabetic.  At this point her right hip pain is daily and it is detrimentally affecting her mobility, her quality of life and her actives daily living.  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  On exam her left hip moves smoothly and fluidly.  Her right hip has significant limitations with range of motion with severe pain in the groin with motion.  Recent x-rays of the right hip show bone-on-bone wear of the right hip that is worsened even from last year.  There is complete loss of superior lateral joint space as well as para-articular osteophytes and sclerotic changes.  Having had hip replacement surgery before she is very aware of the risk and benefits of the surgery and what to expect from an intraoperative postoperative course.  All question concerns were answered and addressed.  Will see her at the time of surgery next month.

## 2023-06-13 ENCOUNTER — Other Ambulatory Visit: Payer: Self-pay

## 2023-06-17 ENCOUNTER — Other Ambulatory Visit: Payer: Self-pay

## 2023-06-20 NOTE — Patient Instructions (Signed)
DUE TO COVID-19 ONLY TWO VISITORS  (aged 67 and older)  ARE ALLOWED TO COME WITH YOU AND STAY IN THE WAITING ROOM ONLY DURING PRE OP AND PROCEDURE.   **NO VISITORS ARE ALLOWED IN THE SHORT STAY AREA OR RECOVERY ROOM!!**  IF YOU WILL BE ADMITTED INTO THE HOSPITAL YOU ARE ALLOWED ONLY FOUR SUPPORT PEOPLE DURING VISITATION HOURS ONLY (7 AM -8PM)   The support person(s) must pass our screening, gel in and out, and wear a mask at all times, including in the patient's room. Patients must also wear a mask when staff or their support person are in the room. Visitors GUEST BADGE MUST BE WORN VISIBLY  One adult visitor may remain with you overnight and MUST be in the room by 8 P.M.     Your procedure is scheduled on: 06/28/23   Report to [facility] Main Entrance    Report to admitting at : 5:15 AM   Call this number if you have problems the morning of surgery [phone]   Do not eat food :After Midnight.   After Midnight you may have the following liquids until: 4:00 AM DAY OF SURGERY  Water Black Coffee (sugar ok, NO MILK/CREAM OR CREAMERS)  Tea (sugar ok, NO MILK/CREAM OR CREAMERS) regular and decaf                             Plain Jell-O (NO RED)                                           Fruit ices (not with fruit pulp, NO RED)                                     Popsicles (NO RED)                                                                  Juice: apple, WHITE grape, WHITE cranberry Sports drinks like Gatorade (NO RED)  The day of surgery:  Drink ONE (1) Pre-Surgery Clear Ensure at : 4:00 AM the morning of surgery. Drink in one sitting. Do not sip.  This drink was given to you during your hospital  pre-op appointment visit. Nothing else to drink after completing the  Pre-Surgery Clear Ensure or G2.          If you have questions, please contact your surgeon's office.  FOLLOW ANY ADDITIONAL PRE OP INSTRUCTIONS YOU RECEIVED FROM YOUR SURGEON'S OFFICE!!!   Oral  Hygiene is also important to reduce your risk of infection.                                    Remember - BRUSH YOUR TEETH THE MORNING OF SURGERY WITH YOUR REGULAR TOOTHPASTE  DENTURES WILL BE REMOVED PRIOR TO SURGERY PLEASE DO NOT APPLY "Poly grip" OR ADHESIVES!!!   Do NOT smoke after Midnight   Take these medicines the morning of surgery with A  SIP OF WATER: NONE. Tylenol as needed.                  You may not have any metal on your body including hair pins, jewelry, and body piercing             Do not wear make-up, lotions, powders, perfumes/cologne, or deodorant  Do not wear nail polish including gel and S&S, artificial/acrylic nails, or any other type of covering on natural nails including finger and toenails. If you have artificial nails, gel coating, etc. that needs to be removed by a nail salon please have this removed prior to surgery or surgery may need to be canceled/ delayed if the surgeon/ anesthesia feels like they are unable to be safely monitored.   Do not shave  48 hours prior to surgery.    Do not bring valuables to the hospital. [facility] IS NOT             RESPONSIBLE   FOR VALUABLES.   Contacts, glasses, or bridgework may not be worn into surgery.   Bring small overnight bag day of surgery.   DO NOT BRING YOUR HOME MEDICATIONS TO THE HOSPITAL. PHARMACY WILL DISPENSE MEDICATIONS LISTED ON YOUR MEDICATION LIST TO YOU DURING YOUR ADMISSION IN THE HOSPITAL!    Patients discharged on the day of surgery will not be allowed to drive home.  Someone NEEDS to stay with you for the first 24 hours after anesthesia.   Special Instructions: Bring a copy of your healthcare power of attorney and living will documents         the day of surgery if you haven't scanned them before.              Please read over the following fact sheets you were given: IF YOU HAVE QUESTIONS ABOUT YOUR PRE-OP INSTRUCTIONS PLEASE CALL [phone]      Pre-operative 5 CHG Bath Instructions    You can play a key role in reducing the risk of infection after surgery. Your skin needs to be as free of germs as possible. You can reduce the number of germs on your skin by washing with CHG (chlorhexidine gluconate) soap before surgery. CHG is an antiseptic soap that kills germs and continues to kill germs even after washing.   DO NOT use if you have an allergy to chlorhexidine/CHG or antibacterial soaps. If your skin becomes reddened or irritated, stop using the CHG and notify one of our RNs at : [phone].   Please shower with the CHG soap starting 4 days before surgery using the following schedule:     Please keep in mind the following:  DO NOT shave, including legs and underarms, starting the day of your first shower.   You may shave your face at any point before/day of surgery.  Place clean sheets on your bed the day you start using CHG soap. Use a clean washcloth (not used since being washed) for each shower. DO NOT sleep with pets once you start using the CHG.   CHG Shower Instructions:  If you choose to wash your hair and private area, wash first with your normal shampoo/soap.  After you use shampoo/soap, rinse your hair and body thoroughly to remove shampoo/soap residue.  Turn the water OFF and apply about 3 tablespoons (45 ml) of CHG soap to a CLEAN washcloth.  Apply CHG soap ONLY FROM YOUR NECK DOWN TO YOUR TOES (washing for 3-5 minutes)  DO NOT use  CHG soap on face, private areas, open wounds, or sores.  Pay special attention to the area where your surgery is being performed.  If you are having back surgery, having someone wash your back for you may be helpful. Wait 2 minutes after CHG soap is applied, then you may rinse off the CHG soap.  Pat dry with a clean towel  Put on clean clothes/pajamas   If you choose to wear lotion, please use ONLY the CHG-compatible lotions on the back of this paper.     Additional instructions for the day of surgery: DO NOT APPLY any  lotions, deodorants, cologne, or perfumes.   Put on clean/comfortable clothes.  Brush your teeth.  Ask your nurse before applying any prescription medications to the skin.   CHG Compatible Lotions   Aveeno Moisturizing lotion  Cetaphil Moisturizing Cream  Cetaphil Moisturizing Lotion  Clairol Herbal Essence Moisturizing Lotion, Dry Skin  Clairol Herbal Essence Moisturizing Lotion, Extra Dry Skin  Clairol Herbal Essence Moisturizing Lotion, Normal Skin  Curel Age Defying Therapeutic Moisturizing Lotion with Alpha Hydroxy  Curel Extreme Care Body Lotion  Curel Soothing Hands Moisturizing Hand Lotion  Curel Therapeutic Moisturizing Cream, Fragrance-Free  Curel Therapeutic Moisturizing Lotion, Fragrance-Free  Curel Therapeutic Moisturizing Lotion, Original Formula  Eucerin Daily Replenishing Lotion  Eucerin Dry Skin Therapy Plus Alpha Hydroxy Crme  Eucerin Dry Skin Therapy Plus Alpha Hydroxy Lotion  Eucerin Original Crme  Eucerin Original Lotion  Eucerin Plus Crme Eucerin Plus Lotion  Eucerin TriLipid Replenishing Lotion  Keri Anti-Bacterial Hand Lotion  Keri Deep Conditioning Original Lotion Dry Skin Formula Softly Scented  Keri Deep Conditioning Original Lotion, Fragrance Free Sensitive Skin Formula  Keri Lotion Fast Absorbing Fragrance Free Sensitive Skin Formula  Keri Lotion Fast Absorbing Softly Scented Dry Skin Formula  Keri Original Lotion  Keri Skin Renewal Lotion Keri Silky Smooth Lotion  Keri Silky Smooth Sensitive Skin Lotion  Nivea Body Creamy Conditioning Oil  Nivea Body Extra Enriched Lotion  Nivea Body Original Lotion  Nivea Body Sheer Moisturizing Lotion Nivea Crme  Nivea Skin Firming Lotion  NutraDerm 30 Skin Lotion  NutraDerm Skin Lotion  NutraDerm Therapeutic Skin Cream  NutraDerm Therapeutic Skin Lotion  ProShield Protective Hand Cream  Provon moisturizing lotion   Incentive Spirometer  An incentive spirometer is a tool that can help keep your  lungs clear and active. This tool measures how well you are filling your lungs with each breath. Taking long deep breaths may help reverse or decrease the chance of developing breathing (pulmonary) problems (especially infection) following: A long period of time when you are unable to move or be active. BEFORE THE PROCEDURE  If the spirometer includes an indicator to show your best effort, your nurse or respiratory therapist will set it to a desired goal. If possible, sit up straight or lean slightly forward. Try not to slouch. Hold the incentive spirometer in an upright position. INSTRUCTIONS FOR USE  Sit on the edge of your bed if possible, or sit up as far as you can in bed or on a chair. Hold the incentive spirometer in an upright position. Breathe out normally. Place the mouthpiece in your mouth and seal your lips tightly around it. Breathe in slowly and as deeply as possible, raising the piston or the ball toward the top of the column. Hold your breath for 3-5 seconds or for as long as possible. Allow the piston or ball to fall to the bottom of the column. Remove the mouthpiece from your  mouth and breathe out normally. Rest for a few seconds and repeat Steps 1 through 7 at least 10 times every 1-2 hours when you are awake. Take your time and take a few normal breaths between deep breaths. The spirometer may include an indicator to show your best effort. Use the indicator as a goal to work toward during each repetition. After each set of 10 deep breaths, practice coughing to be sure your lungs are clear. If you have an incision (the cut made at the time of surgery), support your incision when coughing by placing a pillow or rolled up towels firmly against it. Once you are able to get out of bed, walk around indoors and cough well. You may stop using the incentive spirometer when instructed by your caregiver.  RISKS AND COMPLICATIONS Take your time so you do not get dizzy or light-headed. If  you are in pain, you may need to take or ask for pain medication before doing incentive spirometry. It is harder to take a deep breath if you are having pain. AFTER USE Rest and breathe slowly and easily. It can be helpful to keep track of a log of your progress. Your caregiver can provide you with a simple table to help with this. If you are using the spirometer at home, follow these instructions: SEEK MEDICAL CARE IF:  You are having difficultly using the spirometer. You have trouble using the spirometer as often as instructed. Your pain medication is not giving enough relief while using the spirometer. You develop fever of 100.5 F (38.1 C) or higher. SEEK IMMEDIATE MEDICAL CARE IF:  You cough up bloody sputum that had not been present before. You develop fever of 102 F (38.9 C) or greater. You develop worsening pain at or near the incision site. MAKE SURE YOU:  Understand these instructions. Will watch your condition. Will get help right away if you are not doing well or get worse. Document Released: 12/17/2006 Document Revised: 10/29/2011 Document Reviewed: 02/17/2007 [facility] Patient Information 2014 [place], [place].   ________________________________________________________________________

## 2023-06-21 ENCOUNTER — Encounter (HOSPITAL_COMMUNITY): Payer: Self-pay

## 2023-06-21 ENCOUNTER — Other Ambulatory Visit: Payer: Self-pay

## 2023-06-21 ENCOUNTER — Encounter (HOSPITAL_COMMUNITY)
Admission: RE | Admit: 2023-06-21 | Discharge: 2023-06-21 | Disposition: A | Discharge status: Home / Self Care | Payer: BC Managed Care – PPO | Source: Ambulatory Visit | Attending: Orthopaedic Surgery

## 2023-06-21 VITALS — BP 101/62 | HR 70 | Temp 97.8°F | Ht 64.0 in | Wt 118.0 lb

## 2023-06-21 DIAGNOSIS — Z01818 Encounter for other preprocedural examination: Secondary | ICD-10-CM | POA: Diagnosis present

## 2023-06-21 DIAGNOSIS — M1611 Unilateral primary osteoarthritis, right hip: Secondary | ICD-10-CM

## 2023-06-21 LAB — CBC
HCT: 39 % (ref 36.0–46.0)
Hemoglobin: 12.5 g/dL (ref 12.0–15.0)
MCH: 33.2 pg (ref 26.0–34.0)
MCHC: 32.1 g/dL (ref 30.0–36.0)
MCV: 103.7 fL — ABNORMAL HIGH (ref 80.0–100.0)
Platelets: 296 10*3/uL (ref 150–400)
RBC: 3.76 MIL/uL — ABNORMAL LOW (ref 3.87–5.11)
RDW: 13 % (ref 11.5–15.5)
WBC: 5.5 10*3/uL (ref 4.0–10.5)
nRBC: 0 % (ref 0.0–0.2)

## 2023-06-21 LAB — SURGICAL PCR SCREEN
MRSA, PCR: NEGATIVE
Staphylococcus aureus: NEGATIVE

## 2023-06-21 LAB — COMPREHENSIVE METABOLIC PANEL
ALT: 17 U/L (ref 0–44)
AST: 20 U/L (ref 15–41)
Albumin: 4.3 g/dL (ref 3.5–5.0)
Alkaline Phosphatase: 53 U/L (ref 38–126)
Anion gap: 12 (ref 5–15)
BUN: 17 mg/dL (ref 8–23)
CO2: 27 mmol/L (ref 22–32)
Calcium: 9.2 mg/dL (ref 8.9–10.3)
Chloride: 99 mmol/L (ref 98–111)
Creatinine, Ser: 0.5 mg/dL (ref 0.44–1.00)
GFR, Estimated: >60 mL/min (ref >60)
Glucose, Bld: 105 mg/dL — ABNORMAL HIGH (ref 70–99)
Potassium: 4.2 mmol/L (ref 3.5–5.1)
Sodium: 138 mmol/L (ref 135–145)
Total Bilirubin: 0.8 mg/dL (ref 0.3–1.2)
Total Protein: 7.3 g/dL (ref 6.5–8.1)

## 2023-06-21 NOTE — Progress Notes (Signed)
For Anesthesia: PCP - Shelva Majestic, MD  Cardiologist - N/A  Bowel Prep reminder:N/A  Chest x-ray -  EKG -  Stress Test -  ECHO -  Cardiac Cath -  Pacemaker/ICD device last checked: Pacemaker orders received: Device Rep notified:  Spinal Cord Stimulator:N/A  Sleep Study - N/A CPAP -   Fasting Blood Sugar - N/A Checks Blood Sugar _____ times a day Date and result of last Hgb A1c-  Last dose of GLP1 agonist- N/A GLP1 instructions:   Last dose of SGLT-2 inhibitors- N/A SGLT-2 instructions:   Blood Thinner Instructions:N/A Aspirin Instructions: Last Dose:  Activity level: Can go up a flight of stairs and activities of daily living without stopping and without chest pain and/or shortness of breath   Able to exercise without chest pain and/or shortness of breath  Anesthesia review:   Patient denies shortness of breath, fever, cough and chest pain at PAT appointment   Patient verbalized understanding of instructions that were given to them at the PAT appointment. Patient was also instructed that they will need to review over the PAT instructions again at home before surgery.

## 2023-06-27 NOTE — H&P (Signed)
TOTAL HIP ADMISSION H&P  Patient is admitted for right total hip arthroplasty.  Subjective:  Chief Complaint: right hip pain  HPI: Bridget Mccarthy, 67 y.o. female, has a history of pain and functional disability in the right hip(s) due to arthritis and patient has failed non-surgical conservative treatments for greater than 12 weeks to include NSAID's and/or analgesics, corticosteriod injections, and activity modification.  Onset of symptoms was gradual starting 2 years ago with gradually worsening course since that time.The patient noted no past surgery on the right hip(s).  Patient currently rates pain in the right hip at 9 out of 10 with activity. Patient has night pain, worsening of pain with activity and weight bearing, pain that interfers with activities of daily living, and pain with passive range of motion. Patient has evidence of subchondral sclerosis, periarticular osteophytes, and joint space narrowing by imaging studies. This condition presents safety issues increasing the risk of falls.  There is no current active infection.  Patient Active Problem List   Diagnosis Date Noted   Unilateral primary osteoarthritis, right hip 07/02/2022   Status post left hip replacement 10/20/2021   Unilateral primary osteoarthritis, left hip 09/04/2021   Vitamin D deficiency 01/03/2018   Former smoker 01/03/2018   Past Medical History:  Diagnosis Date   Anemia    hx of   Arthritis    Chicken pox    COVID 07/2020   Hyperkalemia 10/2022   Low vitamin D level 2019   Osteopenia 06/03/2008   Osteoporosis 2024   SYMPTOMATIC MENOPAUSAL/FEMALE CLIMACTERIC STATES 01/15/2008   UTI (urinary tract infection)     Past Surgical History:  Procedure Laterality Date   AUGMENTATION MAMMAPLASTY Bilateral 1988   replaced in 2013   BREAST SURGERY  1988   augmentation--Replaced 2013   TOTAL HIP ARTHROPLASTY Left 10/20/2021   Procedure: Left TOTAL HIP ARTHROPLASTY ANTERIOR APPROACH;  Surgeon:  Kathryne Hitch, MD;  Location: [facility];  Service: Orthopedics;  Laterality: Left;    No current facility-administered medications for this encounter.   Current Outpatient Medications  Medication Sig Dispense Refill Last Dose   acetaminophen (TYLENOL) 500 MG tablet Take 500-1,000 mg by mouth every 6 (six) hours as needed for moderate pain.      Biotin w/ Vitamins C & E (HAIR SKIN & NAILS GUMMIES PO) Take 2 each by mouth daily.      cholecalciferol (VITAMIN D3) 25 MCG (1000 UNIT) tablet Take 1,000 Units by mouth daily.      cyanocobalamin (VITAMIN B12) 1000 MCG tablet Take 1,000 mcg by mouth daily.      meloxicam (MOBIC) 15 MG tablet Take 15 mg by mouth daily as needed for pain.      Multiple Vitamin (MULTIVITAMIN) tablet Take 1 tablet by mouth daily.      traMADol (ULTRAM) 50 MG tablet Take 50 mg by mouth 3 (three) times daily as needed for severe pain (pain score 7-10) or moderate pain (pain score 4-6).      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 Didn't have to take benadryl or otherthough and resolved    Social History   Tobacco Use   Smoking status: Former    Current packs/day: 0.00    Average packs/day: 0.1 packs/day for 20.0 years (2.0 ttl pk-yrs)    Types: Cigarettes    Start date: 08/21/1983    Quit date: 08/21/2003    Years since quitting: 19.8   Smokeless tobacco:  Never  Substance Use Topics   Alcohol use: Yes    Comment: 1-2 a week    Family History  Problem Relation Age of Onset   Cancer Father        unknown origin/cause--he was an alcoholic   Alcohol abuse Father        estranged from her life   Other Mother        Dec 67 with complications from Shy-Drager Syndrome   Lung cancer Maternal Grandfather        heavy smoker   Other Paternal Grandmother        she died after father's birth     Review of Systems  Objective:  Physical Exam Vitals reviewed.  Constitutional:       Appearance: Normal appearance. She is normal weight.  HENT:     Head: Normocephalic and atraumatic.  Eyes:     Extraocular Movements: Extraocular movements intact.     Pupils: Pupils are equal, round, and reactive to light.  Cardiovascular:     Rate and Rhythm: Normal rate and regular rhythm.     Pulses: Normal pulses.  Pulmonary:     Effort: Pulmonary effort is normal.     Breath sounds: Normal breath sounds.  Abdominal:     Palpations: Abdomen is soft.  Musculoskeletal:     Cervical back: Normal range of motion and neck supple.     Right hip: Tenderness and bony tenderness present. Decreased range of motion. Decreased strength.  Neurological:     Mental Status: She is alert and oriented to person, place, and time.  Psychiatric:        Behavior: Behavior normal.     Vital signs in last 24 hours:    Labs:   Estimated body mass index is 20.25 kg/m as calculated from the following:   Height as of 06/21/23: 5\' 4"  (1.626 m).   Weight as of 06/21/23: 53.5 kg.   Imaging Review Plain radiographs demonstrate severe degenerative joint disease of the right hip(s). The bone quality appears to be excellent for age and reported activity level.      Assessment/Plan:  End stage arthritis, right hip(s)  The patient history, physical examination, clinical judgement of the provider and imaging studies are consistent with end stage degenerative joint disease of the right hip(s) and total hip arthroplasty is deemed medically necessary. The treatment options including medical management, injection therapy, arthroscopy and arthroplasty were discussed at length. The risks and benefits of total hip arthroplasty were presented and reviewed. The risks due to aseptic loosening, infection, stiffness, dislocation/subluxation,  thromboembolic complications and other imponderables were discussed.  The patient acknowledged the explanation, agreed to proceed with the plan and consent was signed. Patient  is being admitted for inpatient treatment for surgery, pain control, PT, OT, prophylactic antibiotics, VTE prophylaxis, progressive ambulation and ADL's and discharge planning.The patient is planning to be discharged home with home health services

## 2023-06-27 NOTE — Anesthesia Preprocedure Evaluation (Addendum)
Anesthesia Evaluation  Patient identified by MRN, date of birth, ID band Patient awake    Reviewed: Allergy & Precautions, NPO status , Patient's Chart, lab work & pertinent test results  Airway Mallampati: II  TM Distance: <3 FB Neck ROM: Full    Dental  (+) Teeth Intact, Dental Advisory Given   Pulmonary former smoker   Pulmonary exam normal breath sounds clear to auscultation       Cardiovascular negative cardio ROS Normal cardiovascular exam Rhythm:Regular Rate:Normal     Neuro/Psych negative neurological ROS     GI/Hepatic negative GI ROS, Neg liver ROS,,,  Endo/Other  negative endocrine ROS    Renal/GU negative Renal ROS     Musculoskeletal  (+) Arthritis , Osteoarthritis,    Abdominal   Peds  Hematology negative hematology ROS (+) Plt 296k   Anesthesia Other Findings   Reproductive/Obstetrics                             Anesthesia Physical Anesthesia Plan  ASA: 2  Anesthesia Plan: Spinal   Post-op Pain Management: Tylenol PO (pre-op)*, Gabapentin PO (pre-op)* and Toradol IV (intra-op)*   Induction: Intravenous  PONV Risk Score and Plan: 2 and Midazolam, TIVA, Dexamethasone and Ondansetron  Airway Management Planned: Natural Airway and Simple Face Mask  Additional Equipment:   Intra-op Plan:   Post-operative Plan:   Informed Consent: I have reviewed the patients History and Physical, chart, labs and discussed the procedure including the risks, benefits and alternatives for the proposed anesthesia with the patient or authorized representative who has indicated his/her understanding and acceptance.     Dental advisory given  Plan Discussed with: CRNA  Anesthesia Plan Comments:        Anesthesia Quick Evaluation

## 2023-06-28 ENCOUNTER — Ambulatory Visit (HOSPITAL_COMMUNITY): Payer: BC Managed Care – PPO | Admitting: Anesthesiology

## 2023-06-28 ENCOUNTER — Observation Stay (HOSPITAL_COMMUNITY)
Admission: RE | Admit: 2023-06-28 | Discharge: 2023-06-30 | Disposition: A | Discharge status: Home / Self Care | Payer: BC Managed Care – PPO | Attending: Orthopaedic Surgery | Admitting: Orthopaedic Surgery

## 2023-06-28 ENCOUNTER — Other Ambulatory Visit: Payer: Self-pay

## 2023-06-28 ENCOUNTER — Encounter (HOSPITAL_COMMUNITY)
Admission: RE | Disposition: A | Discharge status: Home / Self Care | Payer: Self-pay | Source: Home / Self Care | Attending: Orthopaedic Surgery

## 2023-06-28 ENCOUNTER — Encounter (HOSPITAL_COMMUNITY): Payer: Self-pay | Admitting: Orthopaedic Surgery

## 2023-06-28 ENCOUNTER — Observation Stay (HOSPITAL_COMMUNITY): Payer: BC Managed Care – PPO

## 2023-06-28 ENCOUNTER — Ambulatory Visit (HOSPITAL_COMMUNITY): Payer: BC Managed Care – PPO

## 2023-06-28 DIAGNOSIS — Z87891 Personal history of nicotine dependence: Secondary | ICD-10-CM | POA: Insufficient documentation

## 2023-06-28 DIAGNOSIS — Z8616 Personal history of COVID-19: Secondary | ICD-10-CM | POA: Diagnosis not present

## 2023-06-28 DIAGNOSIS — Z79899 Other long term (current) drug therapy: Secondary | ICD-10-CM | POA: Diagnosis not present

## 2023-06-28 DIAGNOSIS — M1611 Unilateral primary osteoarthritis, right hip: Secondary | ICD-10-CM | POA: Diagnosis present

## 2023-06-28 DIAGNOSIS — Z96642 Presence of left artificial hip joint: Secondary | ICD-10-CM | POA: Insufficient documentation

## 2023-06-28 DIAGNOSIS — Z96641 Presence of right artificial hip joint: Secondary | ICD-10-CM

## 2023-06-28 HISTORY — PX: TOTAL HIP ARTHROPLASTY: SHX124

## 2023-06-28 LAB — TYPE AND SCREEN
ABO/RH(D): A POS
Antibody Screen: NEGATIVE

## 2023-06-28 SURGERY — ARTHROPLASTY, HIP, TOTAL, ANTERIOR APPROACH
Anesthesia: Spinal | Site: Hip | Laterality: Right

## 2023-06-28 MED ORDER — PROPOFOL 1000 MG/100ML IV EMUL
INTRAVENOUS | Status: AC
  Filled 2023-06-28: qty 100

## 2023-06-28 MED ORDER — EPHEDRINE 5 MG/ML INJ
INTRAVENOUS | Status: AC
  Filled 2023-06-28: qty 5

## 2023-06-28 MED ORDER — POVIDONE-IODINE 10 % EX SWAB: 2.0000 | Freq: Once | CUTANEOUS | Status: DC

## 2023-06-28 MED ORDER — FENTANYL CITRATE (PF) 100 MCG/2ML IJ SOLN
INTRAMUSCULAR | Status: DC | PRN
  Administered 2023-06-28: 25 ug via INTRAVENOUS
  Administered 2023-06-28: 50 ug via INTRAVENOUS
  Administered 2023-06-28: 25 ug via INTRAVENOUS

## 2023-06-28 MED ORDER — CEFAZOLIN SODIUM-DEXTROSE 1-4 GM/50ML-% IV SOLN
1.0000 g | Freq: Four times a day (QID) | INTRAVENOUS | Status: AC
  Administered 2023-06-28 (×2): 1 g via INTRAVENOUS
  Filled 2023-06-28 (×2): qty 50

## 2023-06-28 MED ORDER — BUPIVACAINE IN DEXTROSE 0.75-8.25 % IT SOLN
INTRATHECAL | Status: DC | PRN
  Administered 2023-06-28: 1.6 mL via INTRATHECAL

## 2023-06-28 MED ORDER — CHLORHEXIDINE GLUCONATE 0.12 % MT SOLN
15.0000 mL | Freq: Once | OROMUCOSAL | Status: AC
Start: 2023-06-28 — End: 2023-06-28
  Administered 2023-06-28: 15 mL via OROMUCOSAL

## 2023-06-28 MED ORDER — DEXAMETHASONE SODIUM PHOSPHATE 10 MG/ML IJ SOLN
INTRAMUSCULAR | Status: AC
  Filled 2023-06-28: qty 1

## 2023-06-28 MED ORDER — OXYCODONE HCL 5 MG PO TABS
5.0000 mg | ORAL_TABLET | ORAL | Status: DC | PRN
Start: 2023-06-28 — End: 2023-06-30
  Administered 2023-06-28 – 2023-06-30 (×7): 5 mg via ORAL
  Filled 2023-06-28: qty 2
  Filled 2023-06-28 (×5): qty 1

## 2023-06-28 MED ORDER — ONDANSETRON HCL 4 MG/2ML IJ SOLN
INTRAMUSCULAR | Status: AC
Start: 2023-06-28 — End: ?
  Filled 2023-06-28: qty 2

## 2023-06-28 MED ORDER — ACETAMINOPHEN 325 MG PO TABS
325.0000 mg | ORAL_TABLET | Freq: Four times a day (QID) | ORAL | Status: DC | PRN
  Administered 2023-06-28 – 2023-06-30 (×4): 650 mg via ORAL
  Filled 2023-06-28 (×4): qty 2

## 2023-06-28 MED ORDER — ORAL CARE MOUTH RINSE: 15.0000 mL | Freq: Once | OROMUCOSAL | Status: AC

## 2023-06-28 MED ORDER — ALUM & MAG HYDROXIDE-SIMETH 200-200-20 MG/5ML PO SUSP: 30.0000 mL | ORAL | Status: DC | PRN

## 2023-06-28 MED ORDER — METOCLOPRAMIDE HCL 5 MG PO TABS: 5.0000 mg | ORAL_TABLET | Freq: Three times a day (TID) | ORAL | Status: DC | PRN

## 2023-06-28 MED ORDER — SODIUM CHLORIDE 0.9 % IR SOLN
Status: DC | PRN
  Administered 2023-06-28: 1000 mL

## 2023-06-28 MED ORDER — SODIUM CHLORIDE 0.9 % IV SOLN: INTRAVENOUS | Status: DC

## 2023-06-28 MED ORDER — EPHEDRINE SULFATE-NACL 50-0.9 MG/10ML-% IV SOSY
PREFILLED_SYRINGE | INTRAVENOUS | Status: DC | PRN
  Administered 2023-06-28: 5 mg via INTRAVENOUS

## 2023-06-28 MED ORDER — MENTHOL 3 MG MT LOZG: 1.0000 | LOZENGE | OROMUCOSAL | Status: DC | PRN

## 2023-06-28 MED ORDER — AMISULPRIDE (ANTIEMETIC) 5 MG/2ML IV SOLN: 10.0000 mg | Freq: Once | INTRAVENOUS | Status: DC | PRN

## 2023-06-28 MED ORDER — PANTOPRAZOLE SODIUM 40 MG PO TBEC
40.0000 mg | DELAYED_RELEASE_TABLET | Freq: Every day | ORAL | Status: DC
  Administered 2023-06-28 – 2023-06-30 (×3): 40 mg via ORAL
  Filled 2023-06-28 (×3): qty 1

## 2023-06-28 MED ORDER — MIDAZOLAM HCL 5 MG/5ML IJ SOLN
INTRAMUSCULAR | Status: DC | PRN
  Administered 2023-06-28: 2 mg via INTRAVENOUS

## 2023-06-28 MED ORDER — METHOCARBAMOL 500 MG PO TABS
500.0000 mg | ORAL_TABLET | Freq: Four times a day (QID) | ORAL | Status: DC | PRN
  Administered 2023-06-28 – 2023-06-30 (×4): 500 mg via ORAL
  Filled 2023-06-28 (×4): qty 1

## 2023-06-28 MED ORDER — PROPOFOL 500 MG/50ML IV EMUL
INTRAVENOUS | Status: DC | PRN
  Administered 2023-06-28: 100 ug/kg/min via INTRAVENOUS

## 2023-06-28 MED ORDER — GABAPENTIN 300 MG PO CAPS
300.0000 mg | ORAL_CAPSULE | Freq: Once | ORAL | Status: AC
  Administered 2023-06-28: 300 mg via ORAL
  Filled 2023-06-28: qty 1

## 2023-06-28 MED ORDER — METHOCARBAMOL 1000 MG/10ML IJ SOLN: 500.0000 mg | Freq: Four times a day (QID) | INTRAMUSCULAR | Status: DC | PRN

## 2023-06-28 MED ORDER — 0.9 % SODIUM CHLORIDE (POUR BTL) OPTIME
TOPICAL | Status: DC | PRN
  Administered 2023-06-28: 1000 mL

## 2023-06-28 MED ORDER — ONDANSETRON HCL 4 MG/2ML IJ SOLN: 4.0000 mg | Freq: Once | INTRAMUSCULAR | Status: DC | PRN

## 2023-06-28 MED ORDER — TRANEXAMIC ACID-NACL 1000-0.7 MG/100ML-% IV SOLN
1000.0000 mg | INTRAVENOUS | Status: AC
  Administered 2023-06-28: 1000 mg via INTRAVENOUS
  Filled 2023-06-28: qty 100

## 2023-06-28 MED ORDER — VITAMIN D 25 MCG (1000 UNIT) PO TABS
1000.0000 [IU] | ORAL_TABLET | Freq: Every day | ORAL | Status: DC
  Administered 2023-06-28 – 2023-06-30 (×3): 1000 [IU] via ORAL
  Filled 2023-06-28 (×3): qty 1

## 2023-06-28 MED ORDER — FENTANYL CITRATE (PF) 100 MCG/2ML IJ SOLN
INTRAMUSCULAR | Status: AC
  Filled 2023-06-28: qty 2

## 2023-06-28 MED ORDER — PHENOL 1.4 % MT LIQD: 1.0000 | OROMUCOSAL | Status: DC | PRN

## 2023-06-28 MED ORDER — PROPOFOL 10 MG/ML IV BOLUS
INTRAVENOUS | Status: DC | PRN
  Administered 2023-06-28 (×2): 20 mg via INTRAVENOUS

## 2023-06-28 MED ORDER — MIDAZOLAM HCL 2 MG/2ML IJ SOLN
INTRAMUSCULAR | Status: AC
Start: 2023-06-28 — End: ?
  Filled 2023-06-28: qty 2

## 2023-06-28 MED ORDER — FENTANYL CITRATE PF 50 MCG/ML IJ SOSY
PREFILLED_SYRINGE | INTRAMUSCULAR | Status: AC
  Filled 2023-06-28: qty 1

## 2023-06-28 MED ORDER — CEFAZOLIN SODIUM-DEXTROSE 2-4 GM/100ML-% IV SOLN
2.0000 g | INTRAVENOUS | Status: AC
  Administered 2023-06-28: 2 g via INTRAVENOUS
  Filled 2023-06-28: qty 100

## 2023-06-28 MED ORDER — DEXAMETHASONE SODIUM PHOSPHATE 10 MG/ML IJ SOLN
INTRAMUSCULAR | Status: DC | PRN
  Administered 2023-06-28: 10 mg via INTRAVENOUS

## 2023-06-28 MED ORDER — PHENYLEPHRINE HCL-NACL 20-0.9 MG/250ML-% IV SOLN
INTRAVENOUS | Status: AC
  Filled 2023-06-28: qty 250

## 2023-06-28 MED ORDER — PHENYLEPHRINE 80 MCG/ML (10ML) SYRINGE FOR IV PUSH (FOR BLOOD PRESSURE SUPPORT)
PREFILLED_SYRINGE | INTRAVENOUS | Status: DC | PRN
  Administered 2023-06-28: 160 ug via INTRAVENOUS
  Administered 2023-06-28 (×3): 80 ug via INTRAVENOUS

## 2023-06-28 MED ORDER — PHENYLEPHRINE 80 MCG/ML (10ML) SYRINGE FOR IV PUSH (FOR BLOOD PRESSURE SUPPORT)
PREFILLED_SYRINGE | INTRAVENOUS | Status: AC
  Filled 2023-06-28: qty 10

## 2023-06-28 MED ORDER — DIPHENHYDRAMINE HCL 12.5 MG/5ML PO ELIX: 12.5000 mg | ORAL_SOLUTION | ORAL | Status: DC | PRN

## 2023-06-28 MED ORDER — METOCLOPRAMIDE HCL 5 MG/ML IJ SOLN: 5.0000 mg | Freq: Three times a day (TID) | INTRAMUSCULAR | Status: DC | PRN

## 2023-06-28 MED ORDER — ONDANSETRON HCL 4 MG/2ML IJ SOLN
INTRAMUSCULAR | Status: DC | PRN
  Administered 2023-06-28: 4 mg via INTRAVENOUS

## 2023-06-28 MED ORDER — ASPIRIN 81 MG PO CHEW
81.0000 mg | CHEWABLE_TABLET | Freq: Two times a day (BID) | ORAL | Status: DC
  Administered 2023-06-28 – 2023-06-30 (×4): 81 mg via ORAL
  Filled 2023-06-28 (×4): qty 1

## 2023-06-28 MED ORDER — ONDANSETRON HCL 4 MG/2ML IJ SOLN: 4.0000 mg | Freq: Four times a day (QID) | INTRAMUSCULAR | Status: DC | PRN

## 2023-06-28 MED ORDER — HYDROMORPHONE HCL 1 MG/ML IJ SOLN
0.5000 mg | INTRAMUSCULAR | Status: DC | PRN
Start: 2023-06-28 — End: 2023-06-30

## 2023-06-28 MED ORDER — LACTATED RINGERS IV SOLN: INTRAVENOUS | Status: DC

## 2023-06-28 MED ORDER — DOCUSATE SODIUM 100 MG PO CAPS
100.0000 mg | ORAL_CAPSULE | Freq: Two times a day (BID) | ORAL | Status: DC
  Administered 2023-06-28 – 2023-06-30 (×5): 100 mg via ORAL
  Filled 2023-06-28 (×5): qty 1

## 2023-06-28 MED ORDER — FENTANYL CITRATE PF 50 MCG/ML IJ SOSY
25.0000 ug | PREFILLED_SYRINGE | INTRAMUSCULAR | Status: DC | PRN
  Administered 2023-06-28 (×2): 50 ug via INTRAVENOUS

## 2023-06-28 MED ORDER — ONDANSETRON HCL 4 MG PO TABS: 4.0000 mg | ORAL_TABLET | Freq: Four times a day (QID) | ORAL | Status: DC | PRN

## 2023-06-28 MED ORDER — ACETAMINOPHEN 500 MG PO TABS
1000.0000 mg | ORAL_TABLET | Freq: Once | ORAL | Status: AC
  Administered 2023-06-28: 1000 mg via ORAL
  Filled 2023-06-28: qty 2

## 2023-06-28 MED ORDER — OXYCODONE HCL 5 MG PO TABS
10.0000 mg | ORAL_TABLET | ORAL | Status: DC | PRN
Start: 2023-06-28 — End: 2023-06-30
  Filled 2023-06-28: qty 2

## 2023-06-28 SURGICAL SUPPLY — 43 items
APL SKNCLS STERI-STRIP NONHPOA (GAUZE/BANDAGES/DRESSINGS)
BAG COUNTER SPONGE SURGICOUNT (BAG) ×2 IMPLANT
BAG SPEC THK2 15X12 ZIP CLS (MISCELLANEOUS)
BAG SPNG CNTER NS LX DISP (BAG) ×1
BAG ZIPLOCK 12X15 (MISCELLANEOUS) IMPLANT
BENZOIN TINCTURE PRP APPL 2/3 (GAUZE/BANDAGES/DRESSINGS) IMPLANT
BLADE SAW SGTL 18X1.27X75 (BLADE) ×2 IMPLANT
COVER PERINEAL POST (MISCELLANEOUS) ×2 IMPLANT
COVER SURGICAL LIGHT HANDLE (MISCELLANEOUS) ×2 IMPLANT
DRAPE FOOT SWITCH (DRAPES) ×2 IMPLANT
DRAPE STERI IOBAN 125X83 (DRAPES) ×2 IMPLANT
DRAPE U-SHAPE 47X51 STRL (DRAPES) ×4 IMPLANT
DRSG AQUACEL AG ADV 3.5X10 (GAUZE/BANDAGES/DRESSINGS) ×2 IMPLANT
DURAPREP 26ML APPLICATOR (WOUND CARE) ×2 IMPLANT
ELECT REM PT RETURN 15FT ADLT (MISCELLANEOUS) ×2 IMPLANT
FEM STEM 12/14 TAPER SZ 4 HIP (Orthopedic Implant) ×1 IMPLANT
FEMORAL STEM 12/14 TPR SZ4 HIP (Orthopedic Implant) IMPLANT
GAUZE XEROFORM 1X8 LF (GAUZE/BANDAGES/DRESSINGS) IMPLANT
GLOVE BIO SURGEON STRL SZ7.5 (GLOVE) ×2 IMPLANT
GLOVE BIOGEL PI IND STRL 8 (GLOVE) ×4 IMPLANT
GLOVE ECLIPSE 8.0 STRL XLNG CF (GLOVE) ×2 IMPLANT
GOWN STRL REUS W/ TWL XL LVL3 (GOWN DISPOSABLE) ×4 IMPLANT
GOWN STRL REUS W/TWL XL LVL3 (GOWN DISPOSABLE) ×2
HANDPIECE INTERPULSE COAX TIP (DISPOSABLE) ×1
HEAD CERAMIC DELTA 36 PLUS 1.5 (Hips) IMPLANT
HOLDER FOLEY CATH W/STRAP (MISCELLANEOUS) ×2 IMPLANT
KIT TURNOVER KIT A (KITS) IMPLANT
LINER NEUTRAL 52X36MM PLUS 4 (Liner) IMPLANT
PACK ANTERIOR HIP CUSTOM (KITS) ×2 IMPLANT
PIN SECTOR W/GRIP ACE CUP 52MM (Hips) IMPLANT
SET HNDPC FAN SPRY TIP SCT (DISPOSABLE) ×2 IMPLANT
SLEEVE CABLE 2MM VT (Orthopedic Implant) IMPLANT
STAPLER SKIN PROX WIDE 3.9 (STAPLE) IMPLANT
STRIP CLOSURE SKIN 1/2X4 (GAUZE/BANDAGES/DRESSINGS) IMPLANT
SUT ETHIBOND NAB CT1 #1 30IN (SUTURE) ×2 IMPLANT
SUT ETHILON 2 0 PS N (SUTURE) IMPLANT
SUT MNCRL AB 4-0 PS2 18 (SUTURE) IMPLANT
SUT VIC AB 0 CT1 36 (SUTURE) ×2 IMPLANT
SUT VIC AB 1 CT1 36 (SUTURE) ×2 IMPLANT
SUT VIC AB 2-0 CT1 27 (SUTURE) ×2
SUT VIC AB 2-0 CT1 TAPERPNT 27 (SUTURE) ×4 IMPLANT
TRAY FOLEY MTR SLVR 16FR STAT (SET/KITS/TRAYS/PACK) IMPLANT
YANKAUER SUCT BULB TIP NO VENT (SUCTIONS) ×2 IMPLANT

## 2023-06-28 NOTE — Transfer of Care (Signed)
Immediate Anesthesia Transfer of Care Note  Patient: Bridget Mccarthy  Procedure(s) Performed: RIGHT TOTAL HIP ARTHROPLASTY ANTERIOR APPROACH (Right: Hip)  Patient Location: PACU  Anesthesia Type:Spinal  Level of Consciousness: awake, alert , and oriented  Airway & Oxygen Therapy: Patient Spontanous Breathing and Patient connected to face mask oxygen  Post-op Assessment: Report given to RN and Post -op Vital signs reviewed and stable  Post vital signs: Reviewed and stable  Last Vitals:  Vitals Value Taken Time  BP 100/61 06/28/23 0901  Temp    Pulse 73 06/28/23 0902  Resp 15 06/28/23 0902  SpO2 100 % 06/28/23 0902  Vitals shown include unfiled device data.  Last Pain:  Vitals:   06/28/23 0530  TempSrc: Oral         Complications: No notable events documented.

## 2023-06-28 NOTE — TOC Transition Note (Signed)
Transition of Care [facility]) - CM/SW Discharge Note  Patient Details  Name: Bridget Mccarthy MRN: [id number] Date of Birth: 08/03/1956  Transition of Care [facility]) CM/SW Contact:  Ewing Schlein, LCSW Phone Number: 06/28/2023, 2:11 PM  Clinical Narrative: Patient is expected to discharge home tomorrow after working with PT. CSW met with patient to confirm discharge plan. Patient will go home with HHPT, which was prearranged with [facility]. Patient has a rolling walker at home, so there are no DME needs at this time. TOC signing off.  Final next level of care: Home w Home Health Services Barriers to Discharge: No Barriers Identified  Patient Goals and CMS Choice CMS Medicare.gov Compare Post Acute Care list provided to:: Patient Choice offered to / list presented to : Patient  Discharge Plan and Services Additional resources added to the After Visit Summary for        DME Arranged: N/A DME Agency: NA HH Arranged: PT HH Agency: [facility] Representative spoke with at [facility] Agency: Prearranged in orthopedist's office  Social Determinants of Health (SDOH) Interventions SDOH Screenings   Food Insecurity: No Food Insecurity (06/28/2023)  Housing: Low Risk  (06/28/2023)  Transportation Needs: No Transportation Needs (06/28/2023)  Utilities: Not At Risk (06/28/2023)  Depression (PHQ2-9): Low Risk  (05/18/2021)  Tobacco Use: Medium Risk (06/28/2023)   Readmission Risk Interventions     No data to display

## 2023-06-28 NOTE — Plan of Care (Signed)
  Problem: Education: Goal: Knowledge of General Education information will improve Description: Including pain rating scale, medication(s)/side effects and non-pharmacologic comfort measures Outcome: Progressing   Problem: Activity: Goal: Risk for activity intolerance will decrease Outcome: Progressing   Problem: Nutrition: Goal: Adequate nutrition will be maintained Outcome: Progressing   Problem: Elimination: Goal: Will not experience complications related to bowel motility Outcome: Progressing   Problem: Pain Management: Goal: General experience of comfort will improve Outcome: Progressing   Problem: Safety: Goal: Ability to remain free from injury will improve Outcome: Progressing   Problem: Skin Integrity: Goal: Risk for impaired skin integrity will decrease Outcome: Progressing   Problem: Education: Goal: Knowledge of the prescribed therapeutic regimen will improve Outcome: Progressing   Problem: Activity: Goal: Ability to avoid complications of mobility impairment will improve Outcome: Progressing   Problem: Pain Management: Goal: Pain level will decrease with appropriate interventions Outcome: Progressing

## 2023-06-28 NOTE — Evaluation (Signed)
Physical Therapy Evaluation Patient Details Name: Bridget Mccarthy MRN: [id number] DOB: 1956-04-02 Today's Date: 06/28/2023  History of Present Illness  Pt s/p R THR and with hx of L THR, osteoporosis  Clinical Impression  Pt s/p R THR and presents with decreased R LE strength/ROM, post op pain and orthostatic hypotension (76/46 in sitting at bedside) limiting functional mobility.  Pt should progress to dc home with family assist.        If plan is discharge home, recommend the following: A lot of help with walking and/or transfers;A little help with bathing/dressing/bathroom;Assistance with cooking/housework;Assist for transportation;Help with stairs or ramp for entrance   Can travel by private vehicle        Equipment Recommendations None recommended by PT  Recommendations for Other Services       Functional Status Assessment Patient has had a recent decline in their functional status and demonstrates the ability to make significant improvements in function in a reasonable and predictable amount of time.     Precautions / Restrictions Precautions Precautions: Fall Restrictions Weight Bearing Restrictions: No      Mobility  Bed Mobility Overal bed mobility: Needs Assistance Bed Mobility: Supine to Sit, Sit to Supine     Supine to sit: Min assist Sit to supine: Mod assist   General bed mobility comments: Increased time with cues for sequence; use of bed pad to assist    Transfers                   General transfer comment: NT 2* dizziness and falling BP in sitting    Ambulation/Gait                  Stairs            Wheelchair Mobility     Tilt Bed    Modified Rankin (Stroke Patients Only)       Balance Overall balance assessment: Needs assistance Sitting-balance support: No upper extremity supported, Feet supported Sitting balance-Leahy Scale: Good                                       Pertinent  Vitals/Pain      Home Living Family/patient expects to be discharged to:: Private residence Living Arrangements: Spouse/significant other Available Help at Discharge: Family Type of Home: House Home Access: Stairs to enter Entrance Stairs-Rails: None Secretary/administrator of Steps: 3   Home Layout: One level Home Equipment: Agricultural consultant (2 wheels);BSC/3in1      Prior Function Prior Level of Function : Independent/Modified Independent                     Extremity/Trunk Assessment   Upper Extremity Assessment Upper Extremity Assessment: Overall WFL for tasks assessed    Lower Extremity Assessment Lower Extremity Assessment: RLE deficits/detail    Cervical / Trunk Assessment Cervical / Trunk Assessment: Normal  Communication   Communication Communication: No apparent difficulties  Cognition Arousal: Alert Behavior During Therapy: WFL for tasks assessed/performed Overall Cognitive Status: Within Functional Limits for tasks assessed                                          General Comments      Exercises Total Joint Exercises Ankle Circles/Pumps: AROM, Both, 15 reps, Supine  Assessment/Plan    PT Assessment Patient needs continued PT services  PT Problem List Decreased strength;Decreased range of motion;Decreased activity tolerance;Decreased balance;Decreased mobility;Decreased knowledge of use of DME;Pain       PT Treatment Interventions DME instruction;Stair training;Gait training;Functional mobility training;Therapeutic activities;Therapeutic exercise;Patient/family education    PT Goals (Current goals can be found in the Care Plan section)  Acute Rehab PT Goals Patient Stated Goal: Regain IND PT Goal Formulation: With patient Time For Goal Achievement: 07/05/23 Potential to Achieve Goals: Good    Frequency 7X/week     Co-evaluation               AM-PAC PT "6 Clicks" Mobility  Outcome Measure Help needed turning  from your back to your side while in a flat bed without using bedrails?: A Little Help needed moving from lying on your back to sitting on the side of a flat bed without using bedrails?: A Little Help needed moving to and from a bed to a chair (including a wheelchair)?: A Lot Help needed standing up from a chair using your arms (e.g., wheelchair or bedside chair)?: Total Help needed to walk in hospital room?: Total Help needed climbing 3-5 steps with a railing? : Total 6 Click Score: 11    End of Session Equipment Utilized During Treatment: Gait belt Activity Tolerance: Other (comment) (nausea, orthostatic) Patient left: in bed;with call bell/phone within reach;with bed alarm set;with family/visitor present Nurse Communication: Mobility status PT Visit Diagnosis: Difficulty in walking, not elsewhere classified (R26.2)    Time: 7253-6644 PT Time Calculation (min) (ACUTE ONLY): 22 min   Charges:   PT Evaluation $PT Eval Low Complexity: 1 Low   PT General Charges $$ ACUTE PT VISIT: 1 Visit         Mauro Kaufmann PT Acute Rehabilitation Services Pager [phone] Office [phone]   Jocie Meroney 06/28/2023, 5:19 PM

## 2023-06-28 NOTE — Anesthesia Procedure Notes (Signed)
Spinal  Patient location during procedure: OR End time: 06/28/2023 7:16 AM Reason for block: surgical anesthesia Staffing Performed: resident/CRNA  Resident/CRNA: Enriqueta Shutter D, CRNA Performed by: Terrionna Bridwell, Clinical cytogeneticist D, CRNA Authorized by: Collene Schlichter, MD   Preanesthetic Checklist Completed: patient identified, IV checked, site marked, risks and benefits discussed, surgical consent, monitors and equipment checked, pre-op evaluation and timeout performed Spinal Block Patient position: sitting Prep: DuraPrep Patient monitoring: heart rate, continuous pulse ox and blood pressure Approach: midline Location: L3-4 Injection technique: single-shot Needle Needle type: Pencan  Needle gauge: 24 G Needle length: 9 cm Assessment Sensory level: T6 Events: CSF return

## 2023-06-28 NOTE — Interval H&P Note (Signed)
History and Physical Interval Note: The patient is here today for right total hip arthroplasty to treat her severe right hip arthritis.  There has been no acute or interval change in her medical status.  The risks and benefits of surgery been discussed in detail and informed consent has been obtained.  The right operative hip has been marked.  06/28/2023 6:55 AM  Bridget Mccarthy  has presented today for surgery, with the diagnosis of osteoarthritis right hip.  The various methods of treatment have been discussed with the patient and family. After consideration of risks, benefits and other options for treatment, the patient has consented to  Procedure(s): RIGHT TOTAL HIP ARTHROPLASTY ANTERIOR APPROACH (Right) as a surgical intervention.  The patient's history has been reviewed, patient examined, no change in status, stable for surgery.  I have reviewed the patient's chart and labs.  Questions were answered to the patient's satisfaction.     Kathryne Hitch

## 2023-06-28 NOTE — Op Note (Signed)
Operative Note  Date of operation: 06/28/2023 Preoperative diagnosis: Right hip primary osteoarthritis Postoperative diagnosis: Same  Procedure: Right direct anterior total hip arthroplasty  Implants: Implant Name Type Inv. Item Serial No. Manufacturer Lot No. LRB No. Used Action  PIN SECTOR W/GRIP ACE CUP - [id number] Hips PIN SECTOR W/GRIP ACE CUP  [employer]PU[employer] [id number] Right 1 Implanted  LINER NEUTRAL 52X36MM PLUS 4 - [id number] Liner LINER NEUTRAL 52X36MM PLUS 4  DEPUY ORTHOPAEDICS [id number] Right 1 Implanted  FEM STEM 12/14 TAPER SZ 4 HIP - [id number] Orthopedic Implant FEM STEM 12/14 TAPER SZ 4 HIP  DEPUY ORTH[employer] [id number] Right 1 Implanted  SLEEVE CABLE VT - [id number] Orthopedic Implant SLEEVE CABLE VT  [employer]YK[employer] [id number] Right 1 Implanted  HEAD CERAMIC DELTA 36 PLUS 1.5 - [id number] Hips HEAD CERAMIC DELTA 36 PLUS 1.5  DEPU[employer]TH[employer] [id number] Right 1 Implanted   Surgeon: Vanita Panda. Magnus Ivan, MD Assistant: Rexene Edison, PA-C  Anesthesia: Spinal EBL: 150 to 200 cc Antibiotics: IV Ancef Complications: None  Indications: The patient is a 67 year old female with debilitating arthritis involving her right hip.  This is been well-documented clinical exam and x-ray findings.  Her pain with the right hip is bleeding worse for over a year now.  We actually placed her left hip due to severe arthritis several years back.  Her right hip pain is daily and it is detrimentally affecting her mobility, her quality of life and her activities daily living to the point she does wish to proceed with a hip replacement on the right side.  Having had this before she is fully aware of the risk of acute blood loss anemia, nerve vessel injury, fracture, infection, DVT, implant failure, leg length differences, dislocation and wound healing issues.  She understands that our goals are hopefully decrease pain, improve mobility, and improve quality of life.  Procedure  description: After informed consent was obtained and the appropriate right hip was marked, the patient was brought to the operating room and set up on the stretcher where spinal anesthesia was obtained.  She was then laid in supine position on stretcher and a Foley catheter is placed.  Traction boots were placed on both her feet and she was next placed supine on the Hana fracture table with a perineal post in place in both legs and inline skeletal traction devices no traction applied.  Her right operative hip and pelvis were assessed radiographically.  The right hip was prepped and draped with DuraPrep and sterile drapes.  A timeout was called and she was identified as the correct patient and the correct right hip.  An incision was then made just inferior and posterior to the ASIS and carried slightly obliquely down the leg.  Dissection was carried down to the tensor fascia lata muscle and the tensor fascia was then divided longitudinally to proceed with a direct interposed the hip.  Circumflex vessels were identified and cauterized.  The hip capsule was identified and opened up in L-type format finding significant arthritis around the lateral right femoral head and neck.  A cobra tractors placed around the medial and lateral femoral neck and a femoral neck cut was made with an oscillating saw just proximal to the lesser trochanter.  This cut was completed with an osteotome.  A corkscrew guide was placed in the femoral head and the femoral head was removed its entirety and there was water devoid of cartilage.  A bent Hohmann was then placed over the medial acetabular rim  and remnants of the acetabulum and rather debris removed.  Reaming was initiating from a size 43 reamer and stepwise increments going up to a size 51 reamer with all reamers placed under direct feels elation and elastomer placed on direct fluoroscopy in order to obtain the depth of reaming, the inclination and anteversion.  The real DePuy sector  GRIPTION Astra component size 52 was then placed followed by a 36+4 polythene liner based off her offset and medialization of the cup.  Attention was then turned to the femur.  With the right leg externally rotated to 120 degrees, extended and adducted, a Mueller retractors placed medially and a Hohmann tractor was placed behind the greater trochanter.  The lateral joint capsule was released and a box cutting osteotome was used in her femoral canal.  We then began broaching using the Actis broaching system from a size 0 going up to a size 3.  With size 3 in place we trialed a high offset femoral neck.  We trialed a 36+15 trial hip ball.  This reduced the pelvis.  It was stable but we felt like we needed more offset and a slightly bigger stem.  We then broached up to a size 4 stem after dislocating the hip and remove the previous trial components.  The size 4 stem sat where we wanted to sit.  We then removed the trial components.  We placed the real Actis femoral component size 4 with high offset but the last had the mallet did cause a small crack in the calcar.  The stem was tight but we still thought it was worth placing a cable around the calcar just for extra fixation.  Again the stem stayed stable the whole time and we did place a single cable around the proximal femur just for extra security.  We then placed a 36+1.5 ceramic head ball and reduces the pelvis.  We are pleased with leg length, offset, range of motion and stability.  Postop we will still allow weightbearing as tolerated.  The wound was then irrigated with normal saline solution.  The joint capsule remnants were closed with interrupted #1 Ethibond suture followed by #1 Vicryl to close the tensor fascia.  0 Vicryl was used to close the deep tissue and 2-0 Vicryl was used to close subcutaneous tissue.  The skin was closed with staples.  An Aquacel dressing was applied.  The patient was taken off the Hana table and taken the recovery room.  Rexene Edison,  PA-C did assist during the entire case and beginning to end and his assistance was medically necessary and crucial for soft tissue management and retraction, helping guide implant placement and a layered closure of the wound.

## 2023-06-28 NOTE — Anesthesia Postprocedure Evaluation (Signed)
Anesthesia Post Note  Patient: Bridget Mccarthy  Procedure(s) Performed: RIGHT TOTAL HIP ARTHROPLASTY ANTERIOR APPROACH (Right: Hip)     Patient location during evaluation: PACU Anesthesia Type: Spinal Level of consciousness: awake, awake and alert and oriented Pain management: pain level controlled Vital Signs Assessment: post-procedure vital signs reviewed and stable Respiratory status: spontaneous breathing, nonlabored ventilation and respiratory function stable Cardiovascular status: blood pressure returned to baseline and stable Postop Assessment: no headache, no backache, spinal receding and no apparent nausea or vomiting Anesthetic complications: no   No notable events documented.  Last Vitals:  Vitals:   06/28/23 1031 06/28/23 1341  BP: 138/77 122/66  Pulse: 78 (!) 58  Resp: 18 18  Temp: 36.5 C 36.5 C  SpO2: 96% 98%    Last Pain:  Vitals:   06/28/23 1200  TempSrc:   PainSc: Asleep                 Collene Schlichter

## 2023-06-29 DIAGNOSIS — M1611 Unilateral primary osteoarthritis, right hip: Secondary | ICD-10-CM | POA: Diagnosis not present

## 2023-06-29 LAB — CBC
HCT: 31.3 % — ABNORMAL LOW (ref 36.0–46.0)
Hemoglobin: 10.2 g/dL — ABNORMAL LOW (ref 12.0–15.0)
MCH: 33.1 pg (ref 26.0–34.0)
MCHC: 32.6 g/dL (ref 30.0–36.0)
MCV: 101.6 fL — ABNORMAL HIGH (ref 80.0–100.0)
Platelets: 225 10*3/uL (ref 150–400)
RBC: 3.08 MIL/uL — ABNORMAL LOW (ref 3.87–5.11)
RDW: 13 % (ref 11.5–15.5)
WBC: 8.9 10*3/uL (ref 4.0–10.5)
nRBC: 0 % (ref 0.0–0.2)

## 2023-06-29 LAB — BASIC METABOLIC PANEL
Anion gap: 5 (ref 5–15)
BUN: 12 mg/dL (ref 8–23)
CO2: 29 mmol/L (ref 22–32)
Calcium: 8.4 mg/dL — ABNORMAL LOW (ref 8.9–10.3)
Chloride: 101 mmol/L (ref 98–111)
Creatinine, Ser: 0.51 mg/dL (ref 0.44–1.00)
GFR, Estimated: >60 mL/min (ref >60)
Glucose, Bld: 137 mg/dL — ABNORMAL HIGH (ref 70–99)
Potassium: 4 mmol/L (ref 3.5–5.1)
Sodium: 135 mmol/L (ref 135–145)

## 2023-06-29 MED ORDER — OXYCODONE HCL 5 MG PO TABS: 5.0000 mg | ORAL_TABLET | ORAL | 0 refills | Status: DC | PRN

## 2023-06-29 MED ORDER — METHOCARBAMOL 500 MG PO TABS: 500.0000 mg | ORAL_TABLET | Freq: Four times a day (QID) | ORAL | 1 refills | Status: AC | PRN

## 2023-06-29 MED ORDER — ASPIRIN 81 MG PO CHEW: 81.0000 mg | CHEWABLE_TABLET | Freq: Two times a day (BID) | ORAL | 0 refills | Status: AC

## 2023-06-29 NOTE — Progress Notes (Signed)
Physical Therapy Treatment Patient Details Name: Bridget Mccarthy MRN: [id number] DOB: 29-Mar-1956 Today's Date: 06/29/2023   History of Present Illness Pt s/p R THR and with hx of L THR, osteoporosis    PT Comments  Pt cooperative but progress limited by orthostatic hypotension. BP supine 122/70; sitting115/61; standing 78/62 with pt reporting dizziness and need to sit.  RN aware.    If plan is discharge home, recommend the following: A lot of help with walking and/or transfers;A little help with bathing/dressing/bathroom;Assistance with cooking/housework;Assist for transportation;Help with stairs or ramp for entrance   Can travel by private vehicle        Equipment Recommendations  None recommended by PT    Recommendations for Other Services       Precautions / Restrictions Precautions Precautions: Fall Restrictions Weight Bearing Restrictions: No     Mobility  Bed Mobility Overal bed mobility: Needs Assistance Bed Mobility: Supine to Sit     Supine to sit: Min assist     General bed mobility comments: Increased time with cues for sequence; use of bed pad to assist    Transfers Overall transfer level: Needs assistance Equipment used: Rolling walker (2 wheels) Transfers: Sit to/from Stand, Bed to chair/wheelchair/BSC Sit to Stand: Min assist   Step pivot transfers: Min assist       General transfer comment: cues for LE management and use of UEs to self assist    Ambulation/Gait               General Gait Details: step pvt bed to chair only 2* orthostatic hypotension   Stairs             Wheelchair Mobility     Tilt Bed    Modified Rankin (Stroke Patients Only)       Balance Overall balance assessment: Needs assistance Sitting-balance support: No upper extremity supported, Feet supported Sitting balance-Leahy Scale: Good     Standing balance support: Bilateral upper extremity supported Standing balance-Leahy Scale: Poor                               Cognition Arousal: Alert Behavior During Therapy: WFL for tasks assessed/performed Overall Cognitive Status: Within Functional Limits for tasks assessed                                          Exercises Total Joint Exercises Ankle Circles/Pumps: AROM, Both, 15 reps, Supine Quad Sets: AROM, Both, 10 reps, Supine Heel Slides: AAROM, Right, 20 reps, Supine Hip ABduction/ADduction: AAROM, Right, 10 reps, Supine    General Comments        Pertinent Vitals/Pain Pain Assessment Pain Assessment: 0-10 Pain Score: 5  Pain Location: R hip Pain Descriptors / Indicators: Aching, Sore Pain Intervention(s): Limited activity within patient's tolerance, Monitored during session, Premedicated before session, Ice applied    Home Living                          Prior Function            PT Goals (current goals can now be found in the care plan section) Acute Rehab PT Goals Patient Stated Goal: Regain IND PT Goal Formulation: With patient Time For Goal Achievement: 07/05/23 Potential to Achieve Goals: Good Progress towards PT goals: Progressing toward goals  Frequency    7X/week      PT Plan      Co-evaluation              AM-PAC PT "6 Clicks" Mobility   Outcome Measure  Help needed turning from your back to your side while in a flat bed without using bedrails?: A Little Help needed moving from lying on your back to sitting on the side of a flat bed without using bedrails?: A Little Help needed moving to and from a bed to a chair (including a wheelchair)?: A Lot Help needed standing up from a chair using your arms (e.g., wheelchair or bedside chair)?: A Little Help needed to walk in hospital room?: Total Help needed climbing 3-5 steps with a railing? : Total 6 Click Score: 13    End of Session Equipment Utilized During Treatment: Gait belt Activity Tolerance: Other (comment) (orthostatic) Patient  left: in chair;with call bell/phone within reach;with nursing/sitter in room Nurse Communication: Mobility status PT Visit Diagnosis: Difficulty in walking, not elsewhere classified (R26.2)     Time: 7829-5621 PT Time Calculation (min) (ACUTE ONLY): 30 min  Charges:    $Therapeutic Exercise: 8-22 mins $Therapeutic Activity: 8-22 mins PT General Charges $$ ACUTE PT VISIT: 1 Visit                     Mauro Kaufmann PT Acute Rehabilitation Services Pager [phone] Office [phone]    Bridget Mccarthy 06/29/2023, 10:19 AM

## 2023-06-29 NOTE — Discharge Summary (Signed)
Patient ID: Bridget Mccarthy MRN: [id number] DOB/AGE: Jul 28, 1956 67 y.o.  Admit date: 06/28/2023 Discharge date: 06/29/2023  Admission Diagnoses:  Principal Problem:   Unilateral primary osteoarthritis, right hip Active Problems:   Status post total replacement of right hip   Discharge Diagnoses:  Same  Past Medical History:  Diagnosis Date   Anemia    hx of   Arthritis    Chicken pox    COVID 07/2020   Hyperkalemia 10/2022   Low vitamin D level 2019   Osteopenia 06/03/2008   Osteoporosis 2024   SYMPTOMATIC MENOPAUSAL/FEMALE CLIMACTERIC STATES 01/15/2008   UTI (urinary tract infection)     Surgeries: Procedure(s): RIGHT TOTAL HIP ARTHROPLASTY ANTERIOR APPROACH on 06/28/2023   Consultants:   Discharged Condition: Improved  Hospital Course: Bridget Mccarthy is an 67 y.o. female who was admitted 06/28/2023 for operative treatment ofUnilateral primary osteoarthritis, right hip. Patient has severe unremitting pain that affects sleep, daily activities, and work/hobbies. After pre-op clearance the patient was taken to the operating room on 06/28/2023 and underwent  Procedure(s): RIGHT TOTAL HIP ARTHROPLASTY ANTERIOR APPROACH.    Patient was given perioperative antibiotics:  Anti-infectives (From admission, onward)    Start     Dose/Rate Route Frequency Ordered Stop   06/28/23 1330  ceFAZolin (ANCEF) IVPB 1 g/50 mL premix        1 g 100 mL/hr over 30 Minutes Intravenous Every 6 hours 06/28/23 1031 06/28/23 2059   06/28/23 0600  ceFAZolin (ANCEF) IVPB 2g/100 mL premix        2 g 200 mL/hr over 30 Minutes Intravenous On call to O.R. 06/28/23 6578 06/28/23 0717        Patient was given sequential compression devices, early ambulation, and chemoprophylaxis to prevent DVT.  Patient benefited maximally from hospital stay and there were no complications.    Recent vital signs: Patient Vitals for the past 24 hrs:  BP Temp Temp src Pulse Resp SpO2  06/29/23 0623  113/65 98.1 F (36.7 C) -- 85 18 95 %  06/29/23 0118 115/62 98.2 F (36.8 C) -- 86 18 96 %  06/28/23 1341 122/66 97.7 F (36.5 C) -- (!) 58 18 98 %  06/28/23 1031 138/77 97.7 F (36.5 C) Oral 78 18 96 %  06/28/23 1000 129/84 -- -- (!) 59 13 100 %  06/28/23 0945 129/81 -- -- (!) 51 14 100 %  06/28/23 0930 (!) 111/57 -- -- 72 19 100 %  06/28/23 0915 107/60 -- -- 76 14 97 %  06/28/23 0900 100/61 97.6 F (36.4 C) -- 74 17 100 %     Recent laboratory studies:  Recent Labs    06/29/23 0418  WBC 8.9  HGB 10.2*  HCT 31.3*  PLT 225  NA 135  K 4.0  CL 101  CO2 29  BUN 12  CREATININE 0.51  GLUCOSE 137*  CALCIUM 8.4*     Discharge Medications:   Allergies as of 06/29/2023       Reactions   Shrimp [shellfish Allergy] Anaphylaxis   Throat swelling- likely anaphylaxis. Didn't have to take benadryl or otherthough and resolved        Medication List     STOP taking these medications    meloxicam 15 MG tablet Commonly known as: MOBIC   traMADol 50 MG tablet Commonly known as: ULTRAM       TAKE these medications    acetaminophen 500 MG tablet Commonly known as: TYLENOL Take 500-1,000 mg by mouth every  6 (six) hours as needed for moderate pain.   aspirin 81 MG chewable tablet Chew 1 tablet (81 mg total) by mouth 2 (two) times daily.   cholecalciferol 25 MCG (1000 UNIT) tablet Commonly known as: VITAMIN D3 Take 1,000 Units by mouth daily.   cyanocobalamin 1000 MCG tablet Commonly known as: VITAMIN B12 Take 1,000 mcg by mouth daily.   HAIR SKIN & NAILS GUMMIES PO Take 2 each by mouth daily.   methocarbamol 500 MG tablet Commonly known as: ROBAXIN Take 1 tablet (500 mg total) by mouth every 6 (six) hours as needed for muscle spasms.   multivitamin tablet Take 1 tablet by mouth daily.   oxyCODONE 5 MG immediate release tablet Commonly known as: Oxy IR/ROXICODONE Take 1-2 tablets (5-10 mg total) by mouth every 4 (four) hours as needed for moderate pain  (pain score 4-6) (pain score 4-6).               Durable Medical Equipment  (From admission, onward)           Start     Ordered   06/28/23 1032  DME 3 n 1  Once        06/28/23 1031   06/28/23 1032  DME Walker rolling  Once       Question Answer Comment  Walker: With 5 Inch Wheels   Patient needs a walker to treat with the following condition Status post total replacement of right hip      06/28/23 1031            Diagnostic Studies: DG Pelvis Portable  Result Date: 06/28/2023 CLINICAL DATA:  Right hip replacement. EXAM: PORTABLE PELVIS 1-2 VIEWS COMPARISON:  Radiograph 04/12/2023 FINDINGS: Right hip arthroplasty in expected alignment. No periprosthetic lucency or fracture. Cerclage wire fixation about the proximal femoral stem. Recent postsurgical change includes air and edema in the soft tissues. Lateral skin staples in place. Previous left hip arthroplasty. IMPRESSION: Right hip arthroplasty without immediate postoperative complication. Electronically Signed   By: Narda Rutherford M.D.   On: 06/28/2023 12:16   DG HIP PORT UNILAT WITH PELVIS 1V RIGHT  Result Date: 06/28/2023 CLINICAL DATA:  Elective surgery. EXAM: DG HIP (WITH OR WITHOUT PELVIS) 1V PORT RIGHT COMPARISON:  None Available. FINDINGS: Ten fluoroscopic spot views of the pelvis and right hip obtained in the operating room. Sequential images during hip arthroplasty. Cerclage wire traverses the femoral stem. Fluoroscopy time 35 seconds. Dose 1.34 mGy. IMPRESSION: Intraoperative fluoroscopy during right hip arthroplasty. Electronically Signed   By: Narda Rutherford M.D.   On: 06/28/2023 12:16   DG C-Arm 1-60 Min-No Report  Result Date: 06/28/2023 Fluoroscopy was utilized by the requesting physician.  No radiographic interpretation.    Disposition: Discharge disposition: 06-Home-Health Care Svc          Follow-up Information     [facility] Follow up.   Why: [facility] will provide PT in  the home after discharge.        Kathryne Hitch, MD Follow up in 2 week(s).   Specialty: Orthopedic Surgery Contact information: [address] [place] [address] [phone]                  Signed: Richardean Canal 06/29/2023, 8:33 AM

## 2023-06-29 NOTE — Progress Notes (Signed)
Subjective: 1 Day Post-Op Procedure(s) (LRB): RIGHT TOTAL HIP ARTHROPLASTY ANTERIOR APPROACH (Right) Patient reports pain as mild to moderate. Wanting to discharge to home today if does well with PT.   Objective: Vital signs in last 24 hours: Temp:  [97.6 F (36.4 C)-98.2 F (36.8 C)] 98.1 F (36.7 C) (11/09 0623) Pulse Rate:  [51-86] 85 (11/09 0623) Resp:  [13-19] 18 (11/09 0623) BP: (100-138)/(57-84) 113/65 (11/09 0623) SpO2:  [95 %-100 %] 95 % (11/09 0623)  Intake/Output from previous day: 11/08 0701 - 11/09 0700 In: 3014.9 [P.O.:1040; I.V.:1924.9; IV Piggyback:50] Out: 3050 [Urine:2900; Blood:150] Intake/Output this shift: No intake/output data recorded.  Recent Labs    06/29/23 0418  HGB 10.2*   Recent Labs    06/29/23 0418  WBC 8.9  RBC 3.08*  HCT 31.3*  PLT 225   Recent Labs    06/29/23 0418  NA 135  K 4.0  CL 101  CO2 29  BUN 12  CREATININE 0.51  GLUCOSE 137*  CALCIUM 8.4*   No results for input(s): "LABPT", "INR" in the last 72 hours.   Right lower Extremity: Dorsiflexion/Plantar flexion intact Incision: dressing C/D/I Compartment soft   Assessment/Plan: 1 Day Post-Op Procedure(s) (LRB): RIGHT TOTAL HIP ARTHROPLASTY ANTERIOR APPROACH (Right) Up with therapy Discharge home with home health if remains stable and does with PT.       Bridget Mccarthy 06/29/2023, 8:25 AM

## 2023-06-29 NOTE — Plan of Care (Signed)
  Problem: Education: Goal: Knowledge of General Education information will improve Description: Including pain rating scale, medication(s)/side effects and non-pharmacologic comfort measures Outcome: Progressing   Problem: Health Behavior/Discharge Planning: Goal: Ability to manage health-related needs will improve Outcome: Progressing   Problem: Clinical Measurements: Goal: Ability to maintain clinical measurements within normal limits will improve Outcome: Progressing Goal: Will remain free from infection Outcome: Progressing Goal: Diagnostic test results will improve Outcome: Progressing Goal: Respiratory complications will improve Outcome: Progressing Goal: Cardiovascular complication will be avoided Outcome: Progressing   Problem: Activity: Goal: Risk for activity intolerance will decrease Outcome: Progressing   Problem: Nutrition: Goal: Adequate nutrition will be maintained Outcome: Progressing   Problem: Coping: Goal: Level of anxiety will decrease Outcome: Progressing   Problem: Elimination: Goal: Will not experience complications related to bowel motility Outcome: Progressing Goal: Will not experience complications related to urinary retention Outcome: Progressing   Problem: Pain Management: Goal: General experience of comfort will improve Outcome: Progressing   Problem: Safety: Goal: Ability to remain free from injury will improve Outcome: Progressing   Problem: Skin Integrity: Goal: Risk for impaired skin integrity will decrease Outcome: Progressing   Problem: Education: Goal: Knowledge of the prescribed therapeutic regimen will improve Outcome: Progressing Goal: Understanding of discharge needs will improve Outcome: Progressing Goal: Individualized Educational Video(s) Outcome: Progressing   Problem: Activity: Goal: Ability to avoid complications of mobility impairment will improve Outcome: Progressing Goal: Ability to tolerate increased  activity will improve Outcome: Progressing   Problem: Clinical Measurements: Goal: Postoperative complications will be avoided or minimized Outcome: Progressing   Problem: Pain Management: Goal: Pain level will decrease with appropriate interventions Outcome: Progressing

## 2023-06-29 NOTE — Progress Notes (Signed)
Physical Therapy Treatment Patient Details Name: Bridget Mccarthy MRN: [id number] DOB: 1956-05-27 Today's Date: 06/29/2023   History of Present Illness Pt s/p R THR and with hx of L THR, osteoporosis    PT Comments  Pt continues very motivated but progressing slowly with mobility 2* continued symptomatic orthostatic hypotension.  BP supine135/66; sit 118/61; stand 97/60; after ambulating short distance 89/55 - RN aware.    If plan is discharge home, recommend the following: A lot of help with walking and/or transfers;A little help with bathing/dressing/bathroom;Assistance with cooking/housework;Assist for transportation;Help with stairs or ramp for entrance   Can travel by private vehicle        Equipment Recommendations  None recommended by PT    Recommendations for Other Services       Precautions / Restrictions Precautions Precautions: Fall Restrictions Weight Bearing Restrictions: No     Mobility  Bed Mobility Overal bed mobility: Needs Assistance Bed Mobility: Supine to Sit     Supine to sit: Min assist     General bed mobility comments: UP in chair and requests back to same    Transfers Overall transfer level: Needs assistance Equipment used: Rolling walker (2 wheels) Transfers: Sit to/from Stand Sit to Stand: Min assist, Contact guard assist   Step pivot transfers: Min assist       General transfer comment: cues for LE management and use of UEs to self assist    Ambulation/Gait Ambulation/Gait assistance: Min assist Gait Distance (Feet): 10 Feet Assistive device: Rolling walker (2 wheels) Gait Pattern/deviations: Step-to pattern, Decreased step length - right, Decreased step length - left, Shuffle, Trunk flexed Gait velocity: decr     General Gait Details: cues for sequence and position from RW; distance ltd by c/o dizziness and feeling hot   Stairs             Wheelchair Mobility     Tilt Bed    Modified Rankin (Stroke  Patients Only)       Balance Overall balance assessment: Needs assistance Sitting-balance support: No upper extremity supported, Feet supported Sitting balance-Leahy Scale: Good     Standing balance support: Bilateral upper extremity supported Standing balance-Leahy Scale: Poor                              Cognition Arousal: Alert Behavior During Therapy: WFL for tasks assessed/performed Overall Cognitive Status: Within Functional Limits for tasks assessed                                          Exercises Total Joint Exercises Ankle Circles/Pumps: AROM, Both, 15 reps, Supine Quad Sets: AROM, Both, 10 reps, Supine Heel Slides: AAROM, Right, 20 reps, Supine Hip ABduction/ADduction: AAROM, Right, 10 reps, Supine    General Comments        Pertinent Vitals/Pain Pain Assessment Pain Assessment: 0-10 Pain Score: 6  Pain Location: R hip with activity Pain Descriptors / Indicators: Aching, Sore Pain Intervention(s): Limited activity within patient's tolerance, Monitored during session, Premedicated before session, Ice applied    Home Living                          Prior Function            PT Goals (current goals can now be found in the care  plan section) Acute Rehab PT Goals Patient Stated Goal: Regain IND PT Goal Formulation: With patient Time For Goal Achievement: 07/05/23 Potential to Achieve Goals: Good Progress towards PT goals: Progressing toward goals    Frequency    7X/week      PT Plan      Co-evaluation              AM-PAC PT "6 Clicks" Mobility   Outcome Measure  Help needed turning from your back to your side while in a flat bed without using bedrails?: A Little Help needed moving from lying on your back to sitting on the side of a flat bed without using bedrails?: A Little Help needed moving to and from a bed to a chair (including a wheelchair)?: A Lot Help needed standing up from a chair  using your arms (e.g., wheelchair or bedside chair)?: A Little Help needed to walk in hospital room?: Total Help needed climbing 3-5 steps with a railing? : Total 6 Click Score: 13    End of Session Equipment Utilized During Treatment: Gait belt Activity Tolerance: Other (comment) (orthostatic) Patient left: in chair;with call bell/phone within reach;with nursing/sitter in room Nurse Communication: Mobility status PT Visit Diagnosis: Difficulty in walking, not elsewhere classified (R26.2)     Time: 1610-9604 PT Time Calculation (min) (ACUTE ONLY): 18 min  Charges:    $Gait Training: 8-22 mins PT General Charges $$ ACUTE PT VISIT: 1 Visit                     Mauro Kaufmann PT Acute Rehabilitation Services Pager [phone] Office [phone]    Maeli Spacek 06/29/2023, 3:45 PM

## 2023-06-29 NOTE — Discharge Instructions (Signed)

## 2023-06-30 DIAGNOSIS — M1611 Unilateral primary osteoarthritis, right hip: Secondary | ICD-10-CM | POA: Diagnosis not present

## 2023-06-30 NOTE — Plan of Care (Signed)
  Problem: Education: Goal: Knowledge of General Education information will improve Description: Including pain rating scale, medication(s)/side effects and non-pharmacologic comfort measures Outcome: Progressing   Problem: Health Behavior/Discharge Planning: Goal: Ability to manage health-related needs will improve Outcome: Progressing   Problem: Clinical Measurements: Goal: Ability to maintain clinical measurements within normal limits will improve Outcome: Progressing Goal: Will remain free from infection Outcome: Progressing Goal: Diagnostic test results will improve Outcome: Progressing Goal: Respiratory complications will improve Outcome: Progressing Goal: Cardiovascular complication will be avoided Outcome: Progressing   Problem: Activity: Goal: Risk for activity intolerance will decrease Outcome: Progressing   Problem: Nutrition: Goal: Adequate nutrition will be maintained Outcome: Progressing   Problem: Coping: Goal: Level of anxiety will decrease Outcome: Progressing   Problem: Elimination: Goal: Will not experience complications related to bowel motility Outcome: Progressing Goal: Will not experience complications related to urinary retention Outcome: Progressing   Problem: Pain Management: Goal: General experience of comfort will improve Outcome: Progressing   Problem: Safety: Goal: Ability to remain free from injury will improve Outcome: Progressing   Problem: Skin Integrity: Goal: Risk for impaired skin integrity will decrease Outcome: Progressing   Problem: Education: Goal: Knowledge of the prescribed therapeutic regimen will improve Outcome: Progressing Goal: Understanding of discharge needs will improve Outcome: Progressing Goal: Individualized Educational Video(s) Outcome: Progressing   Problem: Activity: Goal: Ability to avoid complications of mobility impairment will improve Outcome: Progressing Goal: Ability to tolerate increased  activity will improve Outcome: Progressing   Problem: Clinical Measurements: Goal: Postoperative complications will be avoided or minimized Outcome: Progressing   Problem: Pain Management: Goal: Pain level will decrease with appropriate interventions Outcome: Progressing

## 2023-06-30 NOTE — Progress Notes (Signed)
Physical Therapy Treatment Patient Details Name: Bridget Mccarthy MRN: [id number] DOB: Aug 11, 1956 Today's Date: 06/30/2023   History of Present Illness Pt s/p R THR and with hx of L THR, osteoporosis    PT Comments  Pt motivated and progressing well including up to bathroom, ambulated in hall, and negotiated stairs.  Pt denies all dizziness and eager for dc home this date.    If plan is discharge home, recommend the following: A little help with walking and/or transfers;A little help with bathing/dressing/bathroom;Assistance with cooking/housework;Assist for transportation;Help with stairs or ramp for entrance   Can travel by private vehicle        Equipment Recommendations  None recommended by PT    Recommendations for Other Services       Precautions / Restrictions Precautions Precautions: Fall Restrictions Weight Bearing Restrictions: No     Mobility  Bed Mobility               General bed mobility comments: UP in chair and requests back to same    Transfers Overall transfer level: Needs assistance Equipment used: Rolling walker (2 wheels) Transfers: Sit to/from Stand Sit to Stand: Min assist, Contact guard assist           General transfer comment: cues for LE management and use of UEs to self assist    Ambulation/Gait Ambulation/Gait assistance: Min assist Gait Distance (Feet): 150 Feet (and 15' into bathroom) Assistive device: Rolling walker (2 wheels) Gait Pattern/deviations: Decreased step length - right, Decreased step length - left, Shuffle, Trunk flexed, Step-to pattern, Step-through pattern Gait velocity: decr     General Gait Details: denies dizziness   Stairs Stairs: Yes Stairs assistance: Min assist Stair Management: No rails, Step to pattern, Backwards, With walker Number of Stairs: 2 General stair comments: cues for sequence; spouse present and states recalls from last surgery   Wheelchair Mobility     Tilt Bed     Modified Rankin (Stroke Patients Only)       Balance Overall balance assessment: Needs assistance Sitting-balance support: No upper extremity supported, Feet supported Sitting balance-Leahy Scale: Good     Standing balance support: No upper extremity supported Standing balance-Leahy Scale: Fair                              Cognition Arousal: Alert Behavior During Therapy: WFL for tasks assessed/performed Overall Cognitive Status: Within Functional Limits for tasks assessed                                          Exercises      General Comments        Pertinent Vitals/Pain Pain Assessment Pain Assessment: 0-10 Pain Score: 5  Pain Location: R hip with activity Pain Descriptors / Indicators: Aching, Sore Pain Intervention(s): Limited activity within patient's tolerance, Monitored during session, Premedicated before session, Ice applied    Home Living                          Prior Function            PT Goals (current goals can now be found in the care plan section) Acute Rehab PT Goals Patient Stated Goal: Regain IND PT Goal Formulation: With patient Time For Goal Achievement: 07/05/23 Potential to Achieve Goals: Good Progress  towards PT goals: Progressing toward goals    Frequency    7X/week      PT Plan      Co-evaluation              AM-PAC PT "6 Clicks" Mobility   Outcome Measure  Help needed turning from your back to your side while in a flat bed without using bedrails?: A Little Help needed moving from lying on your back to sitting on the side of a flat bed without using bedrails?: A Little Help needed moving to and from a bed to a chair (including a wheelchair)?: A Little Help needed standing up from a chair using your arms (e.g., wheelchair or bedside chair)?: A Little Help needed to walk in hospital room?: A Little Help needed climbing 3-5 steps with a railing? : A Little 6 Click Score:  18    End of Session Equipment Utilized During Treatment: Gait belt Activity Tolerance: Patient tolerated treatment well Patient left: in chair;with call bell/phone within reach;with nursing/sitter in room Nurse Communication: Mobility status PT Visit Diagnosis: Difficulty in walking, not elsewhere classified (R26.2)     Time: 1914-7829 PT Time Calculation (min) (ACUTE ONLY): 17 min  Charges:    $Gait Training: 8-22 mins $Therapeutic Activity: 8-22 mins PT General Charges $$ ACUTE PT VISIT: 1 Visit                     Mauro Kaufmann PT Acute Rehabilitation Services Pager [phone] Office [phone]    Fahed Morten 06/30/2023, 2:45 PM

## 2023-06-30 NOTE — Progress Notes (Signed)
Patient ID: Bridget Mccarthy, female   DOB: Jul 04, 1956, 67 y.o.   MRN: [id number] The patient has already had a bedside chair this morning.  She reports that she is feeling much better.  Her vital signs are stable.  She said she did up and went to the bathroom and stood in the bathroom for a while and did not become symptomatic or lightheaded.  Her goal is to hopefully go home this afternoon.  I think that is reasonable.  Her operative hip is otherwise stable.  PT will work with her and the goal is to have her discharge to home this afternoon if things are going well.

## 2023-06-30 NOTE — Progress Notes (Signed)
Physical Therapy Treatment Patient Details Name: Bridget Mccarthy MRN: [id number] DOB: 01-15-1956 Today's Date: 06/30/2023   History of Present Illness Pt s/p R THR and with hx of L THR, osteoporosis    PT Comments  Pt in good spirits and feeling much better than yesterday.  Pt up to bathroom for toileting and hygiene at sink and up to ambulate increased distance in hall with good stability and no c/o dizziness.  BP supine 128/66; sit 110/70; stand 109/69; and after ambulating 118/74.  Will follow up with stair training.  Pt hopeful for dc home this date.   If plan is discharge home, recommend the following: A little help with walking and/or transfers;A little help with bathing/dressing/bathroom;Assistance with cooking/housework;Assist for transportation;Help with stairs or ramp for entrance   Can travel by private vehicle        Equipment Recommendations  None recommended by PT    Recommendations for Other Services       Precautions / Restrictions Precautions Precautions: Fall Restrictions Weight Bearing Restrictions: No     Mobility  Bed Mobility               General bed mobility comments: UP in chair and requests back to same    Transfers Overall transfer level: Needs assistance Equipment used: Rolling walker (2 wheels) Transfers: Sit to/from Stand Sit to Stand: Min assist, Contact guard assist           General transfer comment: cues for LE management and use of UEs to self assist    Ambulation/Gait Ambulation/Gait assistance: Min assist Gait Distance (Feet): 180 Feet (and 15' into bathroom) Assistive device: Rolling walker (2 wheels) Gait Pattern/deviations: Step-to pattern, Decreased step length - right, Decreased step length - left, Shuffle, Trunk flexed Gait velocity: decr     General Gait Details: denies dizziness   Stairs             Wheelchair Mobility     Tilt Bed    Modified Rankin (Stroke Patients Only)        Balance Overall balance assessment: Needs assistance Sitting-balance support: No upper extremity supported, Feet supported Sitting balance-Leahy Scale: Good     Standing balance support: No upper extremity supported Standing balance-Leahy Scale: Fair                              Cognition Arousal: Alert Behavior During Therapy: WFL for tasks assessed/performed Overall Cognitive Status: Within Functional Limits for tasks assessed                                          Exercises      General Comments        Pertinent Vitals/Pain Pain Assessment Pain Assessment: 0-10 Pain Score: 5  Pain Location: R hip with activity Pain Descriptors / Indicators: Aching, Sore Pain Intervention(s): Limited activity within patient's tolerance, Monitored during session, Ice applied, Patient requesting pain meds-RN notified    Home Living                          Prior Function            PT Goals (current goals can now be found in the care plan section) Acute Rehab PT Goals Patient Stated Goal: Regain IND PT Goal Formulation: With patient  Time For Goal Achievement: 07/05/23 Potential to Achieve Goals: Good Progress towards PT goals: Progressing toward goals    Frequency    7X/week      PT Plan      Co-evaluation              AM-PAC PT "6 Clicks" Mobility   Outcome Measure  Help needed turning from your back to your side while in a flat bed without using bedrails?: A Little Help needed moving from lying on your back to sitting on the side of a flat bed without using bedrails?: A Little Help needed moving to and from a bed to a chair (including a wheelchair)?: A Little Help needed standing up from a chair using your arms (e.g., wheelchair or bedside chair)?: A Little Help needed to walk in hospital room?: A Little Help needed climbing 3-5 steps with a railing? : A Little 6 Click Score: 18    End of Session Equipment  Utilized During Treatment: Gait belt Activity Tolerance: Patient tolerated treatment well Patient left: in chair;with call bell/phone within reach;with nursing/sitter in room Nurse Communication: Mobility status PT Visit Diagnosis: Difficulty in walking, not elsewhere classified (R26.2)     Time: 7829-5621 PT Time Calculation (min) (ACUTE ONLY): 26 min  Charges:    $Gait Training: 8-22 mins $Therapeutic Activity: 8-22 mins PT General Charges $$ ACUTE PT VISIT: 1 Visit                     Mauro Kaufmann PT Acute Rehabilitation Services Pager [phone] Office [phone]    Jessie Schrieber 06/30/2023, 12:51 PM

## 2023-06-30 NOTE — Plan of Care (Signed)

## 2023-07-01 ENCOUNTER — Encounter (HOSPITAL_COMMUNITY): Payer: Self-pay | Admitting: Orthopaedic Surgery

## 2023-07-11 ENCOUNTER — Encounter: Payer: Self-pay | Admitting: Orthopaedic Surgery

## 2023-07-11 ENCOUNTER — Ambulatory Visit: Payer: BC Managed Care – PPO | Admitting: Orthopaedic Surgery

## 2023-07-11 DIAGNOSIS — Z96641 Presence of right artificial hip joint: Secondary | ICD-10-CM

## 2023-07-11 NOTE — Progress Notes (Signed)
The patient is here for her first postoperative visit status post a right total hip arthroplasty.  She is 83 and very active and young appearing.  We have replaced her left hip remotely.  She is dealing with some low back pain.  She only took narcotics for about 2 days.  She has been on a baby aspirin once a day.  Both her calfs are soft.  Her ankles are not swollen.  She has excellent range of motion of both knees and feet.  Her right hip is little bit stiff from the surgery itself.  The staples were removed and Steri-Strips applied and incision looks good.  She can drive from my standpoint.  She can try some Aleve or Advil for her low back pain and if that is not getting better she will let us know.  She does have methocarbamol which she has taken and has a refill of that.  Will see her back in a month to see how she is doing overall but no x-rays are needed.

## 2023-08-07 ENCOUNTER — Encounter: Payer: Self-pay | Admitting: Orthopaedic Surgery

## 2023-08-07 ENCOUNTER — Ambulatory Visit (INDEPENDENT_AMBULATORY_CARE_PROVIDER_SITE_OTHER): Payer: BC Managed Care – PPO | Admitting: Orthopaedic Surgery

## 2023-08-07 DIAGNOSIS — Z96641 Presence of right artificial hip joint: Secondary | ICD-10-CM

## 2023-08-07 NOTE — Progress Notes (Signed)
The patient is here today for a postoperative visit at 6 weeks status post a right total hip arthroplasty.  We replaced her left hip in March 2023.  She is walking without an assistive device and her gait appears pretty normal.  She does report stiffness with her right hip and difficulty with ADLs such as putting shoes and socks on.  She does report a little bit of left hip: Pain.  Her left hip does move smoothly and fluidly and her flexion is full.  There is no significant pain on exam today.  Her right hip is a little bit more stiff with internal and external rotation but it is getting there.  From my standpoint she will continue to increase her activities as comfort allows and work on stretching but not to overdo things.  If there are issues before we see her again in 6 months she will let us know.  At that visit we will have a standing AP pelvis only.

## 2023-09-25 ENCOUNTER — Telehealth: Payer: Self-pay | Admitting: Orthopaedic Surgery

## 2023-09-25 NOTE — Telephone Encounter (Signed)
 Patient called stating she has a dental appt February 13th. She asked if she needed to take any antibiotics before due to having surgery with Dr. Lucienne Ryder November 8th. Avelina Leitz [phone]

## 2023-09-26 NOTE — Telephone Encounter (Signed)
Patient aware of the below message from Dr. Blackman  

## 2023-11-20 NOTE — Progress Notes (Signed)
 68 y.o. G0P0000 Married Caucasian female here for annual exam.    Has had both hips replaced.   Traveling to [place] for work.  Is a flight attendant.   PCP: Almira Jaeger, MD   Patient's last menstrual period was 08/20/2008.           Sexually active: No.  The current method of family planning is post menopausal status.    Menopausal hormone therapy:  n/a Exercising: Yes.     Ballroom dancing, walking , lifting weights Smoker:  former  OB History  Gravida Para Term Preterm AB Living  0 0 0 0 0 0  SAB IAB Ectopic Multiple Live Births  0 0 0 0      HEALTH MAINTENANCE: Last 2 paps:  10/10/21 neg: HR HPV neg, 10/03/18 neg: HR HPV neg History of abnormal Pap or positive HPV:  yes,  hx of cryotherapy years ago?  Patient is unclear about her treatment in the past. She also reports a painful procedure in the past which may have been a sonohysterogram.  Mammogram:   03/18/23 - BI-RADS1, cat C 03/15/22 - BI-RADS 1  Colonoscopy:  10/07/15 -  due in 2027 - Dr. Angelo Barge Density: [facility] - osteoporosis of forearm, osteopenia of hip and spine.   Immunization History  Administered Date(s) Administered   Influenza,inj,Quad PF,6+ Mos 06/04/2019   Influenza-Unspecified 05/12/2021   PFIZER(Purple Top)SARS-COV-2 Vaccination 10/26/2019, 11/23/2019   Td 01/15/2008   Tdap 10/03/2018      reports that she quit smoking about 20 years ago. Her smoking use included cigarettes. She started smoking about 40 years ago. She has a 2 pack-year smoking history. She has never used smokeless tobacco. She reports current alcohol use. She reports that she does not use drugs.  Past Medical History:  Diagnosis Date   Anemia    hx of   Arthritis    Chicken pox    COVID 07/2020   Hyperkalemia 10/2022   Low vitamin D level 2019   Osteopenia 06/03/2008   Osteoporosis 2024   SYMPTOMATIC MENOPAUSAL/FEMALE CLIMACTERIC STATES 01/15/2008   UTI (urinary tract infection)     Past Surgical  History:  Procedure Laterality Date   AUGMENTATION MAMMAPLASTY Bilateral 1988   replaced in 2013   BREAST SURGERY  1988   augmentation--Replaced 2013   TOTAL HIP ARTHROPLASTY Left 10/20/2021   Procedure: Left TOTAL HIP ARTHROPLASTY ANTERIOR APPROACH;  Surgeon: Arnie Lao, MD;  Location: [facility];  Service: Orthopedics;  Laterality: Left;   TOTAL HIP ARTHROPLASTY Right 06/28/2023   Procedure: RIGHT TOTAL HIP ARTHROPLASTY ANTERIOR APPROACH;  Surgeon: Arnie Lao, MD;  Location: [facility];  Service: Orthopedics;  Laterality: Right;    Current Outpatient Medications  Medication Sig Dispense Refill   acetaminophen (TYLENOL) 500 MG tablet Take 500-1,000 mg by mouth every 6 (six) hours as needed for moderate pain.     aspirin 81 MG chewable tablet Chew 1 tablet (81 mg total) by mouth 2 (two) times daily. 35 tablet 0   Biotin w/ Vitamins C & E (HAIR SKIN & NAILS GUMMIES PO) Take 2 each by mouth daily.     cholecalciferol (VITAMIN D3) 25 MCG (1000 UNIT) tablet Take 1,000 Units by mouth daily.     cyanocobalamin (VITAMIN B12) 1000 MCG tablet Take 1,000 mcg by mouth daily.     Multiple Vitamin (MULTIVITAMIN) tablet Take 1 tablet by mouth daily.     methocarbamol (ROBAXIN) 500 MG tablet Take 1 tablet (  500 mg total) by mouth every 6 (six) hours as needed for muscle spasms. (Patient not taking: Reported on 12/04/2023) 40 tablet 1   No current facility-administered medications for this visit.    ALLERGIES: Shrimp [shellfish allergy]  Family History  Problem Relation Age of Onset   Cancer Father        unknown origin/cause--he was an alcoholic   Alcohol abuse Father        estranged from her life   Other Mother        Dec 67 with complications from Shy-Drager Syndrome   Lung cancer Maternal Grandfather        heavy smoker   Other Paternal Grandmother        she died after father's birth    Review of Systems  All other systems reviewed and are negative.   PHYSICAL EXAM:   BP 124/76 (BP Location: Right Arm, Patient Position: Sitting, Cuff Size: Small)   Pulse 76   Ht 5' 4.5" (1.638 m)   Wt 123 lb (55.8 kg)   LMP 08/20/2008   SpO2 98%   BMI 20.79 kg/m     General appearance: alert, cooperative and appears stated age Head: normocephalic, without obvious abnormality, atraumatic Neck: no adenopathy, supple, symmetrical, trachea midline and thyroid normal to inspection and palpation Lungs: clear to auscultation bilaterally Breasts: consistent with bilateral implants, no masses or tenderness, No nipple retraction or dimpling, No nipple discharge or bleeding, No axillary adenopathy Heart: regular rate and rhythm Abdomen: soft, non-tender; no masses, no organomegaly Extremities: extremities normal, atraumatic, no cyanosis or edema Skin: skin color, texture, turgor normal. No rashes or lesions Lymph nodes: cervical, supraclavicular, and axillary nodes normal. Neurologic: grossly normal  Pelvic: External genitalia:  no lesions              No abnormal inguinal nodes palpated.              Urethra:  normal appearing urethra with no masses, tenderness or lesions              Bartholins and Skenes: normal                 Vagina: normal appearing vagina with normal color and discharge, no lesions.  Atrophy.               Cervix: no lesions              Pap taken: No. Bimanual Exam:  Uterus:  normal size, contour, position, consistency, mobility, non-tender              Adnexa: no mass, fullness, tenderness              Rectal exam: Yes.  .  Confirms.              Anus:  normal sphincter tone, no lesions  Chaperone was present for exam:  Emmaline Haring, CMA  ASSESSMENT: Well woman visit with gynecologic exam. 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  Atrophy of vagina.   Osteoporosis of forearm, osteopenia of hip and spine.  Vit D deficiency. Anemia on chart review. PHQ-9: 0  PLAN: Mammogram screening discussed. Self breast awareness  reviewed. Pap and HRV collected:  no.  Due in 2028.  Guidelines for Calcium, Vitamin D, regular exercise program including cardiovascular and weight bearing exercise. Medication refills:  NA. PTH, BMP with GFR, phosphorus, vit D, CBC, iron, ferritin.  Follow up:  1 year and  prn.   If starts Fosamax, anticipate needs an office visit for follow up and for possible follow up anemia lab work.    Additional counseling given.  yes. 20  total time was spent for this patient encounter, including preparation, face-to-face counseling with the patient, coordination of care, and documentation of the encounter regarding osteoporosis, risk factors, treatment options, Fosamax, Evista, Prolia discussed with risks and benefits, and monitoring with BMD in addition to doing the well woman visit with gynecologic exam.

## 2023-12-04 ENCOUNTER — Ambulatory Visit (INDEPENDENT_AMBULATORY_CARE_PROVIDER_SITE_OTHER): Payer: BC Managed Care – PPO | Admitting: Obstetrics and Gynecology

## 2023-12-04 ENCOUNTER — Encounter: Payer: Self-pay | Admitting: Obstetrics and Gynecology

## 2023-12-04 VITALS — BP 124/76 | HR 76 | Ht 64.5 in | Wt 123.0 lb

## 2023-12-04 DIAGNOSIS — D649 Anemia, unspecified: Secondary | ICD-10-CM | POA: Diagnosis not present

## 2023-12-04 DIAGNOSIS — N952 Postmenopausal atrophic vaginitis: Secondary | ICD-10-CM | POA: Diagnosis not present

## 2023-12-04 DIAGNOSIS — Z01419 Encounter for gynecological examination (general) (routine) without abnormal findings: Secondary | ICD-10-CM | POA: Diagnosis not present

## 2023-12-04 DIAGNOSIS — M810 Age-related osteoporosis without current pathological fracture: Secondary | ICD-10-CM | POA: Diagnosis not present

## 2023-12-04 DIAGNOSIS — Z1331 Encounter for screening for depression: Secondary | ICD-10-CM

## 2023-12-04 NOTE — Patient Instructions (Addendum)
 Alendronate Tablets What is this medication? ALENDRONATE (a LEN droe nate) prevents and treats osteoporosis. It may also be used to treat Paget disease of the bone. It works by Interior and spatial designer stronger and less likely to break (fracture). It belongs to a group of medications called bisphosphonates. This medicine may be used for other purposes; ask your health care provider or pharmacist if you have questions. COMMON BRAND NAME(S): Fosamax What should I tell my care team before I take this medication? They need to know if you have any of these conditions: Bleeding disorder Cancer Dental disease Difficulty swallowing Infection (fever, chills, cough, sore throat, pain or trouble passing urine) Kidney disease Low levels of calcium or other minerals in the blood Low red blood cell counts Receiving steroids like dexamethasone or prednisone Stomach or intestine problems Trouble sitting or standing for 30 minutes An unusual or allergic reaction to alendronate, other medications, foods, dyes or preservatives Pregnant or trying to get pregnant Breast-feeding How should I use this medication? Take this medication by mouth with a full glass of water. Take it as directed on the prescription label at the same time every day. Take the dose right after waking up. Do not eat or drink anything before taking it. Do not take it with any other drink except water. Do not chew or crush the tablet. After taking it, do not eat breakfast, drink, or take any other medications or vitamins for at least 30 minutes. Sit or stand up for at least 30 minutes after you take it. Do not lie down. Keep taking it unless your care team tells you to stop. A special MedGuide will be given to you by the pharmacist with each prescription and refill. Be sure to read this information carefully each time. Talk to your care team about the use of this medication in children. Special care may be needed. Overdosage: If you think you have  taken too much of this medicine contact a poison control center or emergency room at once. NOTE: This medicine is only for you. Do not share this medicine with others. What if I miss a dose? If you take your medication once a day, skip it. Take your next dose at the scheduled time the next morning. Do not take two doses on the same day. If you take your medication once a week, take the missed dose on the morning after you remember. Do not take two doses on the same day. What may interact with this medication? Aluminum hydroxide Antacids Aspirin Calcium supplements Medications for inflammation like ibuprofen, naproxen, and others Iron supplements Magnesium supplements Vitamins with minerals This list may not describe all possible interactions. Give your health care provider a list of all the medicines, herbs, non-prescription drugs, or dietary supplements you use. Also tell them if you smoke, drink alcohol, or use illegal drugs. Some items may interact with your medicine. What should I watch for while using this medication? Visit your care team for regular checks on your progress. It may be some time before you see the benefit from this medication. Some people who take this medication have severe bone, joint, or muscle pain. This medication may also increase your risk for jaw problems or a broken thigh bone. Tell your care team right away if you have severe pain in your jaw, bones, joints, or muscles. Tell you care team if you have any pain that does not go away or that gets worse. Tell your dentist and dental surgeon that you are  taking this medication. You should not have major dental surgery while on this medication. See your dentist to have a dental exam and fix any dental problems before starting this medication. Take good care of your teeth while on this medication. Make sure you see your dentist for regular follow-up appointments. You should make sure you get enough calcium and vitamin D  while you are taking this medication. Discuss the foods you eat and the vitamins you take with your care team. You may need blood work done while you are taking this medication. What side effects may I notice from receiving this medication? Side effects that you should report to your care team as soon as possible: Allergic reactions--skin rash, itching, hives, swelling of the face, lips, tongue, or throat Low calcium level--muscle pain or cramps, confusion, tingling, or numbness in the hands or feet Osteonecrosis of the jaw--pain, swelling, or redness in the mouth, numbness of the jaw, poor healing after dental work, unusual discharge from the mouth, visible bones in the mouth Pain or trouble swallowing Severe bone, joint, or muscle pain Stomach bleeding--bloody or black, tar-like stools, vomiting blood or brown material that looks like coffee grounds Side effects that usually do not require medical attention (report to your care team if they continue or are bothersome): Constipation Diarrhea Nausea Stomach pain This list may not describe all possible side effects. Call your doctor for medical advice about side effects. You may report side effects to FDA at 1-800-FDA-1088. Where should I keep my medication? Keep out of the reach of children and pets. Store at room temperature between 15 and 30 degrees C (59 and 86 degrees F). Throw away any unused medication after the expiration date. NOTE: This sheet is a summary. It may not cover all possible information. If you have questions about this medicine, talk to your doctor, pharmacist, or health care provider.  2024 Elsevier/Gold Standard (2020-08-18 00:00:00)  Weak, Fragile Bones (Osteoporosis): What to Know  Osteoporosis is when the bones become thin and less dense than normal. Osteoporosis makes bones more fragile and more likely to break. Over time, the condition can cause your bones to become so weak that they break, or fracture, after a  minor fall. Bones in the hip, wrist, and spine are most likely to break. What are the causes? The exact cause of this condition is not known. What increases the risk? Having family members with this condition. Taking: Steroid medicines. Anti-seizure medicines. Being female. Being 1 years old or older. Being of European decent. Smoking or using other products that contain nicotine or tobacco. Not exercising. What are the signs or symptoms? A broken bone might be the first sign, especially if the break results from a fall or injury that usually would not cause a bone to break. Other signs and symptoms include: Pain in the neck or low back. Being hunched over. Getting shorter. How is this diagnosed? This condition may be diagnosed based on: Your medical history. A physical exam. A bone mineral density test, also called a DXA or DEXA test. This test uses X-rays to measure how dense your bones are. How is this treated? This condition may be treated with medicines and supplements, including: Taking medicines to slow bone loss or help make the bones stronger. Taking calcium and vitamin D supplements every day. Taking hormone replacement medicines, such as estrogen for female and testosterone for males. It may also be treated by: Quitting smoking or using tobacco products. Doing exercises. Limiting how much alcohol  you drink. Eating more foods with calcium and vitamin D in them. Monitoring your blood levels of calcium and vitamin D. The goal of treatment is to strengthen your bones and lower your risk for a bone break. Follow these instructions at home: Eating and drinking Eat plenty of food with calcium and vitamin D in them. This may include: Some fish, such as salmon and tuna. Foods that have calcium and vitamin D added to them, such as some cereals. Egg yolks. Cheese. Liver.  Activity Exercise as told. Ask what things are safe for you to do. You may be told to: Do  exercises that make your muscles work to hold your body weight up, such as tai chi, yoga, or walking. These are called weight-bearing exercises. Do exercises to make your muscles stronger, such as lifting weights. These are called muscle-strengthening exercises. Lifestyle Do not drink alcohol if your health care provider tells you not to drink. Your health care provider tells you not to drink. You are pregnant, may be pregnant, or are planning to become pregnant. If you drink alcohol: Limit how much you have to: 0-1 drink a day if you're female. 0-2 drinks a day if you're female. Know how much alcohol is in your drink. In the [place] one drink is one 12 oz bottle of beer (355 mL), one 5 oz glass of wine (148 mL), or one 1 oz glass of hard liquor (44 mL). Do not smoke, vape, or use nicotine or tobacco. Preventing falls Use a cane, walker, scooter, or crutches to help you move around if needed. Keep rooms well-lit and get rid of clutter. Put away things on the floor that could make you trip, such as cords and rugs. Put grab bars in bathrooms and safety rails on stairs. Use rubber mats in slippery areas, like bathrooms. Wear shoes that: Fit you well. Support your feet. Have closed toes. Have rubber soles or low heels. Talk to your provider about all of the medicines you take. Some medicines can make you more likely to fall because they can cause dizziness or changes in blood pressure. General instructions Take your medicines only as told. Keep all follow-up visits. Your provider may want to repeat tests. Where to find more information Bone Health and Osteoporosis Foundation: bonehealthandosteoporosis.org Contact a health care provider if: You have never been screened for osteoporosis and you are: A female who is 72 years old or older. A female who is age 8 years old or older. Get help right away if: You fall. You get hurt. This information is not intended to replace advice given to you  by your health care provider. Make sure you discuss any questions you have with your health care provider. Document Revised: 06/18/2023 Document Reviewed: 02/22/2023 Elsevier Patient Education  2024 Elsevier Inc.  EXERCISE AND DIET:  We recommended that you start or continue a regular exercise program for good health. Regular exercise means any activity that makes your heart beat faster and makes you sweat.  We recommend exercising at least 30 minutes per day at least 3 days a week, preferably 4 or 5.  We also recommend a diet low in fat and sugar.  Inactivity, poor dietary choices and obesity can cause diabetes, heart attack, stroke, and kidney damage, among others.    ALCOHOL AND SMOKING:  Women should limit their alcohol intake to no more than 7 drinks/beers/glasses of wine (combined, not each!) per week. Moderation of alcohol intake to this level decreases your risk of breast  cancer and liver damage. And of course, no recreational drugs are part of a healthy lifestyle.  And absolutely no smoking or even second hand smoke. Most people know smoking can cause heart and lung diseases, but did you know it also contributes to weakening of your bones? Aging of your skin?  Yellowing of your teeth and nails?  CALCIUM AND VITAMIN D:  Adequate intake of calcium and Vitamin D are recommended.  The recommendations for exact amounts of these supplements seem to change often, but generally speaking 600 mg of calcium (either carbonate or citrate) and 800 units of Vitamin D per day seems prudent. Certain women may benefit from higher intake of Vitamin D.  If you are among these women, your doctor will have told you during your visit.    PAP SMEARS:  Pap smears, to check for cervical cancer or precancers,  have traditionally been done yearly, although recent scientific advances have shown that most women can have pap smears less often.  However, every woman still should have a physical exam from her gynecologist every  year. It will include a breast check, inspection of the vulva and vagina to check for abnormal growths or skin changes, a visual exam of the cervix, and then an exam to evaluate the size and shape of the uterus and ovaries.  And after 68 years of age, a rectal exam is indicated to check for rectal cancers. We will also provide age appropriate advice regarding health maintenance, like when you should have certain vaccines, screening for sexually transmitted diseases, bone density testing, colonoscopy, mammograms, etc.   MAMMOGRAMS:  All women over 78 years old should have a yearly mammogram. Many facilities now offer a "3D" mammogram, which may cost around $50 extra out of pocket. If possible,  we recommend you accept the option to have the 3D mammogram performed.  It both reduces the number of women who will be called back for extra views which then turn out to be normal, and it is better than the routine mammogram at detecting truly abnormal areas.    COLONOSCOPY:  Colonoscopy to screen for colon cancer is recommended for all women at age 66.  We know, you hate the idea of the prep.  We agree, BUT, having colon cancer and not knowing it is worse!!  Colon cancer so often starts as a polyp that can be seen and removed at colonscopy, which can quite literally save your life!  And if your first colonoscopy is normal and you have no family history of colon cancer, most women don't have to have it again for 10 years.  Once every ten years, you can do something that may end up saving your life, right?  We will be happy to help you get it scheduled when you are ready.  Be sure to check your insurance coverage so you understand how much it will cost.  It may be covered as a preventative service at no cost, but you should check your particular policy.

## 2023-12-05 ENCOUNTER — Other Ambulatory Visit: Payer: Self-pay | Admitting: Obstetrics and Gynecology

## 2023-12-05 ENCOUNTER — Encounter: Payer: Self-pay | Admitting: Obstetrics and Gynecology

## 2023-12-05 LAB — BASIC METABOLIC PANEL WITHOUT GFR
BUN: 13 mg/dL (ref 7–25)
CO2: 33 mmol/L — ABNORMAL HIGH (ref 20–32)
Calcium: 9.3 mg/dL (ref 8.6–10.4)
Chloride: 98 mmol/L (ref 98–110)
Creat: 0.5 mg/dL (ref 0.50–1.05)
Glucose, Bld: 85 mg/dL (ref 65–99)
Potassium: 4 mmol/L (ref 3.5–5.3)
Sodium: 138 mmol/L (ref 135–146)

## 2023-12-05 LAB — CBC
HCT: 39.8 % (ref 35.0–45.0)
Hemoglobin: 13.2 g/dL (ref 11.7–15.5)
MCH: 33 pg (ref 27.0–33.0)
MCHC: 33.2 g/dL (ref 32.0–36.0)
MCV: 99.5 fL (ref 80.0–100.0)
MPV: 11.1 fL (ref 7.5–12.5)
Platelets: 327 10*3/uL (ref 140–400)
RBC: 4 10*6/uL (ref 3.80–5.10)
RDW: 13 % (ref 11.0–15.0)
WBC: 6.8 10*3/uL (ref 3.8–10.8)

## 2023-12-05 LAB — IRON: Iron: 127 ug/dL (ref 45–160)

## 2023-12-05 LAB — VITAMIN D 25 HYDROXY (VIT D DEFICIENCY, FRACTURES): Vit D, 25-Hydroxy: 46 ng/mL (ref 30–100)

## 2023-12-05 LAB — PHOSPHORUS: Phosphorus: 3.7 mg/dL (ref 2.1–4.3)

## 2023-12-05 LAB — PARATHYROID HORMONE, INTACT (NO CA): PTH: 28 pg/mL (ref 16–77)

## 2023-12-05 LAB — FERRITIN: Ferritin: 20 ng/mL (ref 16–288)

## 2023-12-05 MED ORDER — ALENDRONATE SODIUM 70 MG PO TABS: 70.0000 mg | ORAL_TABLET | ORAL | 0 refills | Status: DC

## 2024-02-05 ENCOUNTER — Other Ambulatory Visit: Payer: Self-pay | Admitting: Family Medicine

## 2024-02-05 DIAGNOSIS — Z1231 Encounter for screening mammogram for malignant neoplasm of breast: Secondary | ICD-10-CM

## 2024-02-06 ENCOUNTER — Ambulatory Visit: Payer: BC Managed Care – PPO | Admitting: Orthopaedic Surgery

## 2024-02-06 ENCOUNTER — Ambulatory Visit

## 2024-02-06 ENCOUNTER — Other Ambulatory Visit (INDEPENDENT_AMBULATORY_CARE_PROVIDER_SITE_OTHER): Payer: Self-pay

## 2024-02-06 ENCOUNTER — Encounter: Payer: Self-pay | Admitting: Orthopaedic Surgery

## 2024-02-06 DIAGNOSIS — Z96641 Presence of right artificial hip joint: Secondary | ICD-10-CM

## 2024-02-06 NOTE — Progress Notes (Signed)
 The patient is now 7 months status post a right total hip arthroplasty.  We replaced her left hip in 2023.  She says she is really doing well but does still have some tightness in the groin and a little bit of numbness on the right side.  The tightness is on both sides though.  She has excellent range of motion of both hips and her leg lengths appear equal.  She is walking with a normal gait.  She does have some tightness with hip flexion on both sides and just a little bit of pain.    An AP pelvis shows bilateral total hip arthroplasties with no complicating features.  At this point she will continue to increase her activities as comfort allows.  We will see her back in 6 months from now to see how she is doing overall but no x-rays are needed unless she is having significant issues.  We may consider her going through outpatient physical therapy for her hip flexor tendons or even sending her to Dr. Vaughn Georges for injections over the psoas tendons if needed.

## 2024-02-21 ENCOUNTER — Other Ambulatory Visit: Payer: Self-pay | Admitting: Obstetrics and Gynecology

## 2024-02-24 NOTE — Telephone Encounter (Signed)
 Med refill request: Fosamax  Last AEX: 12/04/23 BS Next AEX: 12/10/24 BS Last MMG (if hormonal med) 03/18/23 Refill authorized: Last Rx sent #12 tablet (84 day supply) with zero refills on 12/05/23 BS. Please approve or deny.

## 2024-02-26 ENCOUNTER — Other Ambulatory Visit: Payer: Self-pay | Admitting: Obstetrics and Gynecology

## 2024-02-26 ENCOUNTER — Encounter: Payer: Self-pay | Admitting: Obstetrics and Gynecology

## 2024-02-26 NOTE — Telephone Encounter (Signed)
 Patient has an open refill encounter.  Routing medication update.

## 2024-02-26 NOTE — Telephone Encounter (Signed)
 Order signed for Fosamax .

## 2024-03-19 ENCOUNTER — Other Ambulatory Visit: Payer: Self-pay | Admitting: Family Medicine

## 2024-03-19 ENCOUNTER — Ambulatory Visit
Admission: RE | Admit: 2024-03-19 | Discharge: 2024-03-19 | Disposition: A | Discharge status: Home / Self Care | Source: Ambulatory Visit | Attending: Family Medicine | Admitting: Family Medicine

## 2024-03-19 DIAGNOSIS — Z1231 Encounter for screening mammogram for malignant neoplasm of breast: Secondary | ICD-10-CM

## 2024-06-22 ENCOUNTER — Encounter: Payer: Self-pay | Admitting: Radiology

## 2024-08-06 ENCOUNTER — Ambulatory Visit: Admitting: Orthopaedic Surgery

## 2024-08-06 ENCOUNTER — Encounter: Payer: Self-pay | Admitting: Orthopaedic Surgery

## 2024-08-06 DIAGNOSIS — Z96642 Presence of left artificial hip joint: Secondary | ICD-10-CM | POA: Diagnosis not present

## 2024-08-06 DIAGNOSIS — Z96641 Presence of right artificial hip joint: Secondary | ICD-10-CM | POA: Diagnosis not present

## 2024-08-06 DIAGNOSIS — Z96643 Presence of artificial hip joint, bilateral: Secondary | ICD-10-CM

## 2024-08-06 NOTE — Progress Notes (Signed)
 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  The more recently was a year ago.  We did see her in July of this year and had x-rays of both hips and they look good.  She is a dietitian as well.  She denies any issues with the hips at all.  She says she has good range of motion in flexion and does not have any issues with either hip.  On exam she walks with a normal gait.  Her ligaments are equal.  Both hips move smoothly and fluidly with no blocks to rotation and no worrisome findings.  At this point follow-up for her hips can be as needed.  We did talk in length in detail about things we need to bring her back for her hips or other orthopedic issues.  All questions and concerns were addressed and answered.

## 2024-12-10 ENCOUNTER — Ambulatory Visit: Admitting: Obstetrics and Gynecology
